# Patient Record
Sex: Female | Born: 1955
Health system: Southern US, Community
[De-identification: ages and names within clinical notes are randomized; demographics above are authoritative.]

## PROBLEM LIST (undated history)

## (undated) ENCOUNTER — Emergency Department (HOSPITAL_COMMUNITY): Admission: EM | Payer: BC Managed Care – PPO | Source: Home / Self Care

## (undated) DIAGNOSIS — R232 Flushing: Secondary | ICD-10-CM

## (undated) DIAGNOSIS — Z923 Personal history of irradiation: Secondary | ICD-10-CM

## (undated) DIAGNOSIS — J302 Other seasonal allergic rhinitis: Secondary | ICD-10-CM

## (undated) DIAGNOSIS — Z87898 Personal history of other specified conditions: Secondary | ICD-10-CM

## (undated) DIAGNOSIS — C50919 Malignant neoplasm of unspecified site of unspecified female breast: Secondary | ICD-10-CM

## (undated) DIAGNOSIS — K259 Gastric ulcer, unspecified as acute or chronic, without hemorrhage or perforation: Secondary | ICD-10-CM

## (undated) DIAGNOSIS — K219 Gastro-esophageal reflux disease without esophagitis: Secondary | ICD-10-CM

## (undated) DIAGNOSIS — T451X5A Adverse effect of antineoplastic and immunosuppressive drugs, initial encounter: Secondary | ICD-10-CM

## (undated) DIAGNOSIS — C50419 Malignant neoplasm of upper-outer quadrant of unspecified female breast: Principal | ICD-10-CM

## (undated) DIAGNOSIS — D649 Anemia, unspecified: Secondary | ICD-10-CM

## (undated) HISTORY — DX: Anemia, unspecified: D64.9

## (undated) HISTORY — DX: Malignant neoplasm of upper-outer quadrant of unspecified female breast: C50.419

## (undated) HISTORY — DX: Gastro-esophageal reflux disease without esophagitis: K21.9

## (undated) HISTORY — PX: BREAST BIOPSY: SHX20

## (undated) HISTORY — PX: OTHER SURGICAL HISTORY: SHX169

## (undated) HISTORY — DX: Personal history of other specified conditions: Z87.898

---

## 1995-05-31 HISTORY — PX: TOTAL ABDOMINAL HYSTERECTOMY: SHX209

## 2000-03-09 ENCOUNTER — Encounter: Admission: RE | Admit: 2000-03-09 | Discharge: 2000-03-09 | Payer: Self-pay | Admitting: *Deleted

## 2000-03-09 ENCOUNTER — Encounter: Payer: Self-pay | Admitting: *Deleted

## 2000-07-12 ENCOUNTER — Encounter: Admission: RE | Admit: 2000-07-12 | Discharge: 2000-07-12 | Payer: Self-pay | Admitting: Family Medicine

## 2000-07-12 ENCOUNTER — Encounter: Payer: Self-pay | Admitting: Family Medicine

## 2000-09-27 ENCOUNTER — Encounter: Admission: RE | Admit: 2000-09-27 | Discharge: 2000-09-27 | Payer: Self-pay | Admitting: *Deleted

## 2002-07-16 ENCOUNTER — Encounter: Payer: Self-pay | Admitting: Internal Medicine

## 2002-07-16 ENCOUNTER — Encounter: Admission: RE | Admit: 2002-07-16 | Discharge: 2002-07-16 | Payer: Self-pay | Admitting: Internal Medicine

## 2003-07-18 ENCOUNTER — Encounter: Admission: RE | Admit: 2003-07-18 | Discharge: 2003-07-18 | Payer: Self-pay | Admitting: Internal Medicine

## 2004-04-06 ENCOUNTER — Ambulatory Visit: Payer: Self-pay | Admitting: General Practice

## 2004-07-13 ENCOUNTER — Other Ambulatory Visit: Admission: RE | Admit: 2004-07-13 | Discharge: 2004-07-13 | Payer: Self-pay | Admitting: Internal Medicine

## 2005-04-19 ENCOUNTER — Ambulatory Visit: Payer: Self-pay | Admitting: General Practice

## 2005-05-30 HISTORY — PX: CERVICAL DISCECTOMY: SHX98

## 2005-05-30 HISTORY — PX: OTHER SURGICAL HISTORY: SHX169

## 2005-07-22 ENCOUNTER — Encounter: Admission: RE | Admit: 2005-07-22 | Discharge: 2005-07-22 | Payer: Self-pay | Admitting: Internal Medicine

## 2007-08-07 ENCOUNTER — Other Ambulatory Visit: Admission: RE | Admit: 2007-08-07 | Discharge: 2007-08-07 | Payer: Self-pay | Admitting: Internal Medicine

## 2010-05-30 DIAGNOSIS — K259 Gastric ulcer, unspecified as acute or chronic, without hemorrhage or perforation: Secondary | ICD-10-CM | POA: Insufficient documentation

## 2010-05-30 DIAGNOSIS — K219 Gastro-esophageal reflux disease without esophagitis: Secondary | ICD-10-CM | POA: Insufficient documentation

## 2010-05-30 HISTORY — DX: Gastric ulcer, unspecified as acute or chronic, without hemorrhage or perforation: K25.9

## 2010-05-30 HISTORY — DX: Gastro-esophageal reflux disease without esophagitis: K21.9

## 2010-08-16 ENCOUNTER — Other Ambulatory Visit: Payer: Self-pay | Admitting: Internal Medicine

## 2010-08-16 ENCOUNTER — Other Ambulatory Visit (HOSPITAL_COMMUNITY)
Admission: RE | Admit: 2010-08-16 | Discharge: 2010-08-16 | Disposition: A | Discharge status: Home / Self Care | Payer: BC Managed Care – PPO | Source: Ambulatory Visit | Attending: Internal Medicine | Admitting: Internal Medicine

## 2010-08-16 DIAGNOSIS — Z01419 Encounter for gynecological examination (general) (routine) without abnormal findings: Secondary | ICD-10-CM | POA: Insufficient documentation

## 2010-08-16 DIAGNOSIS — Z1159 Encounter for screening for other viral diseases: Secondary | ICD-10-CM | POA: Insufficient documentation

## 2011-03-28 ENCOUNTER — Emergency Department (HOSPITAL_COMMUNITY)
Admission: EM | Admit: 2011-03-28 | Discharge: 2011-03-29 | Discharge status: Against Medical Advice | Payer: BC Managed Care – PPO | Attending: Emergency Medicine | Admitting: Emergency Medicine

## 2011-03-28 DIAGNOSIS — R109 Unspecified abdominal pain: Secondary | ICD-10-CM | POA: Insufficient documentation

## 2011-03-29 ENCOUNTER — Ambulatory Visit (HOSPITAL_COMMUNITY): Admission: RE | Admit: 2011-03-29 | Payer: BC Managed Care – PPO | Source: Ambulatory Visit

## 2011-03-29 ENCOUNTER — Emergency Department (HOSPITAL_COMMUNITY): Payer: BC Managed Care – PPO

## 2011-03-29 ENCOUNTER — Other Ambulatory Visit: Payer: Self-pay | Admitting: Internal Medicine

## 2011-03-29 ENCOUNTER — Emergency Department (HOSPITAL_COMMUNITY)
Admission: EM | Admit: 2011-03-29 | Discharge: 2011-03-29 | Disposition: A | Discharge status: Home / Self Care | Payer: BC Managed Care – PPO | Attending: Emergency Medicine | Admitting: Emergency Medicine

## 2011-03-29 DIAGNOSIS — R1011 Right upper quadrant pain: Secondary | ICD-10-CM

## 2011-03-29 DIAGNOSIS — R188 Other ascites: Secondary | ICD-10-CM

## 2011-03-29 DIAGNOSIS — R112 Nausea with vomiting, unspecified: Secondary | ICD-10-CM | POA: Insufficient documentation

## 2011-03-29 DIAGNOSIS — R109 Unspecified abdominal pain: Secondary | ICD-10-CM | POA: Insufficient documentation

## 2011-03-29 LAB — DIFFERENTIAL
Eosinophils Absolute: 0 10*3/uL (ref 0.0–0.7)
Eosinophils Relative: 0 % (ref 0–5)
Lymphs Abs: 1.1 10*3/uL (ref 0.7–4.0)
Monocytes Relative: 4 % (ref 3–12)

## 2011-03-29 LAB — POCT PREGNANCY, URINE: Preg Test, Ur: NEGATIVE

## 2011-03-29 LAB — CBC
MCH: 30.8 pg (ref 26.0–34.0)
MCHC: 33.9 g/dL (ref 30.0–36.0)
MCV: 90.9 fL (ref 78.0–100.0)
Platelets: 287 10*3/uL (ref 150–400)
RDW: 11.9 % (ref 11.5–15.5)
WBC: 11.4 10*3/uL — ABNORMAL HIGH (ref 4.0–10.5)

## 2011-03-29 LAB — BASIC METABOLIC PANEL
Calcium: 9.4 mg/dL (ref 8.4–10.5)
Creatinine, Ser: 0.69 mg/dL (ref 0.50–1.10)
GFR calc Af Amer: >90 mL/min (ref >90)
GFR calc non Af Amer: >90 mL/min (ref >90)

## 2011-03-29 LAB — LIPASE, BLOOD: Lipase: 14 U/L (ref 11–59)

## 2011-03-29 LAB — URINALYSIS, ROUTINE W REFLEX MICROSCOPIC
Nitrite: NEGATIVE
Protein, ur: NEGATIVE mg/dL
Specific Gravity, Urine: 1.027 (ref 1.005–1.030)
Urobilinogen, UA: 0.2 mg/dL (ref 0.0–1.0)

## 2011-03-29 LAB — HEPATIC FUNCTION PANEL: Total Protein: 7.6 g/dL (ref 6.0–8.3)

## 2011-03-29 MED ORDER — IOHEXOL 300 MG/ML  SOLN
100.0000 mL | Freq: Once | INTRAMUSCULAR | Status: AC | PRN
  Administered 2011-03-29: 100 mL via INTRAVENOUS

## 2011-03-31 ENCOUNTER — Ambulatory Visit
Admission: RE | Admit: 2011-03-31 | Discharge: 2011-03-31 | Disposition: A | Discharge status: Home / Self Care | Payer: BC Managed Care – PPO | Source: Ambulatory Visit | Attending: Internal Medicine | Admitting: Internal Medicine

## 2011-03-31 DIAGNOSIS — R188 Other ascites: Secondary | ICD-10-CM

## 2011-03-31 DIAGNOSIS — R1011 Right upper quadrant pain: Secondary | ICD-10-CM

## 2011-03-31 LAB — URINE CULTURE

## 2011-04-12 ENCOUNTER — Other Ambulatory Visit (HOSPITAL_COMMUNITY): Payer: Self-pay | Admitting: Unknown Physician Specialty

## 2011-04-27 ENCOUNTER — Encounter (INDEPENDENT_AMBULATORY_CARE_PROVIDER_SITE_OTHER): Payer: Self-pay | Admitting: Surgery

## 2011-04-28 ENCOUNTER — Encounter (HOSPITAL_COMMUNITY)
Admission: RE | Admit: 2011-04-28 | Discharge: 2011-04-28 | Disposition: A | Discharge status: Home / Self Care | Payer: BC Managed Care – PPO | Source: Ambulatory Visit | Attending: Unknown Physician Specialty | Admitting: Unknown Physician Specialty

## 2011-04-28 DIAGNOSIS — R109 Unspecified abdominal pain: Secondary | ICD-10-CM | POA: Insufficient documentation

## 2011-04-28 MED ORDER — TECHNETIUM TC 99M MEBROFENIN IV KIT
5.0000 | PACK | Freq: Once | INTRAVENOUS | Status: AC | PRN
  Administered 2011-04-28: 5 via INTRAVENOUS

## 2011-04-28 MED ORDER — SINCALIDE 5 MCG IJ SOLR
INTRAMUSCULAR | Status: AC
  Administered 2011-04-28: 1.58 ug via INTRAVENOUS
  Filled 2011-04-28: qty 5

## 2011-04-30 DIAGNOSIS — C50919 Malignant neoplasm of unspecified site of unspecified female breast: Secondary | ICD-10-CM

## 2011-04-30 HISTORY — DX: Malignant neoplasm of unspecified site of unspecified female breast: C50.919

## 2011-05-03 ENCOUNTER — Ambulatory Visit (INDEPENDENT_AMBULATORY_CARE_PROVIDER_SITE_OTHER): Payer: BC Managed Care – PPO | Admitting: Surgery

## 2011-05-03 ENCOUNTER — Encounter (INDEPENDENT_AMBULATORY_CARE_PROVIDER_SITE_OTHER): Payer: Self-pay | Admitting: Surgery

## 2011-05-03 VITALS — BP 118/80 | HR 80 | Temp 97.2°F | Resp 16 | Ht 67.0 in | Wt 164.4 lb

## 2011-05-03 DIAGNOSIS — N6310 Unspecified lump in the right breast, unspecified quadrant: Secondary | ICD-10-CM

## 2011-05-03 DIAGNOSIS — N63 Unspecified lump in unspecified breast: Secondary | ICD-10-CM

## 2011-05-03 NOTE — Progress Notes (Signed)
Patient ID: Casey Wall, female   DOB: 05/30/1955, 55 y.o.   MRN: [id number]  Chief Complaint  Patient presents with  . New Evaluation    eval of right breast mass    Referred by: Dr. Husain Referral reason Breast mass HPI Casey Wall is a 55 y.o. female.  She has had a history of some breast cysts aspirated in the past. She has no current breast symptoms. A recent mammogram showed an area of asymmetry in the right breast at appears to have increased with little since her prior mammogram. An ultrasound showed a benign area but the radiologist thinks that this is not the same area as seen on the mammogram and has suggested that a needle guided excisional biopsy is appropriate for this. The patient comes here to discuss this issue. HPI  Past Medical History  Diagnosis Date  . GERD (gastroesophageal reflux disease)   . Seasonal rhinitis   . Fibrocystic breast   . Constipation   . Back pain   . Anemia     Past Surgical History  Procedure Date  . Total abdominal hysterectomy 1997  . Cesarean section 1980  . Cervical discectomy 2007    Family History  Problem Relation Age of Onset  . Lung cancer Father   . Diabetes Mother     Social History History  Substance Use Topics  . Smoking status: Never Smoker   . Smokeless tobacco: Never Used  . Alcohol Use: Yes     rare    No Known Allergies  Current Outpatient Prescriptions  Medication Sig Dispense Refill  . cetirizine (ZYRTEC) 10 MG tablet Take 10 mg by mouth daily.        . cholecalciferol (VITAMIN D) 1000 UNITS tablet Take 1,000 Units by mouth daily.        . estradiol (VIVELLE-DOT) 0.05 MG/24HR Place 1 patch onto the skin once a week.        . fluticasone (FLONASE) 50 MCG/ACT nasal spray Place 2 sprays into the nose daily.        . Polyethylene Glycol 3350 (MIRALAX PO) Take by mouth as needed.        . sucralfate (CARAFATE) 1 G tablet       . tretinoin microspheres (RETIN-A MICRO) 0.04 % gel Apply topically at  bedtime.          Review of Systems Review of Systems  Constitutional: Negative for fever, chills and unexpected weight change.  HENT: Negative for hearing loss, congestion, sore throat, trouble swallowing and voice change.   Eyes: Negative for visual disturbance.  Respiratory: Negative for cough and wheezing.   Cardiovascular: Negative for chest pain, palpitations and leg swelling.  Gastrointestinal: Negative for nausea, vomiting, abdominal pain, diarrhea, constipation, blood in stool, abdominal distention and anal bleeding.  Genitourinary: Negative for hematuria, vaginal bleeding and difficulty urinating.  Musculoskeletal: Negative for arthralgias.  Skin: Negative for rash and wound.  Neurological: Negative for seizures, syncope and headaches.  Hematological: Negative for adenopathy. Does not bruise/bleed easily.  Psychiatric/Behavioral: Negative for confusion.    Blood pressure 118/80, pulse 80, temperature 97.2 F (36.2 C), temperature source Temporal, resp. rate 16, height 5' 7" (1.702 m), weight 164 lb 6 oz (74.56 kg).  Physical Exam Physical Exam  Constitutional: She appears well-developed and well-nourished. No distress.  HENT:  Head: Normocephalic and atraumatic.  Eyes: Pupils are equal, round, and reactive to light. Right eye exhibits no discharge. No scleral icterus.  Neck: Neck supple. No   tracheal deviation present. No thyromegaly present.  Cardiovascular: Normal rate and regular rhythm.  Exam reveals no gallop and no friction rub.   No murmur heard. Pulmonary/Chest: Breath sounds normal. No respiratory distress. She has no wheezes.  Abdominal: Soft. She exhibits no distension. There is no tenderness.  Musculoskeletal: She exhibits no edema and no tenderness.  Neurological: No cranial nerve deficit.  Skin: Skin is warm and dry. She is not diaphoretic.  Psychiatric: She has a normal mood and affect. Her behavior is normal.   Breasts: Symmetric in appearance. No right  side mass but she has a well circumscribed left mass, UOQ 2cm, not tender, not fixed, C/W cyst Lymp: No axillary or supraclavicular adenopathy Data Reviewed Mamogram and ultrasound reviewed with radiologist, Dr Jackson. Theright lesion is very suspicious for cancer , its in axillary tail. The left side has what appear to be cysts. He thinks this is amenable to core biopsy  Assessment    Right breast mass, suspicious for ca Left breast mass, likely cystI    Plan I discussed the issues with the patient. She would like to try the core biopsy before going to an excisional biopsy. I told her this is suspicious for cancer           Ozella Comins J 05/03/2011, 9:24 AM    

## 2011-05-03 NOTE — Patient Instructions (Signed)
We will arrange for a needle biopsy of the area on the right and make plans once that is done

## 2011-05-04 ENCOUNTER — Ambulatory Visit
Admission: RE | Admit: 2011-05-04 | Discharge: 2011-05-04 | Disposition: A | Discharge status: Home / Self Care | Payer: BC Managed Care – PPO | Source: Ambulatory Visit | Attending: Surgery | Admitting: Surgery

## 2011-05-04 ENCOUNTER — Other Ambulatory Visit (INDEPENDENT_AMBULATORY_CARE_PROVIDER_SITE_OTHER): Payer: Self-pay | Admitting: Surgery

## 2011-05-04 DIAGNOSIS — N6310 Unspecified lump in the right breast, unspecified quadrant: Secondary | ICD-10-CM

## 2011-05-05 ENCOUNTER — Ambulatory Visit
Admission: RE | Admit: 2011-05-05 | Discharge: 2011-05-05 | Disposition: A | Discharge status: Home / Self Care | Payer: BC Managed Care – PPO | Source: Ambulatory Visit | Attending: Surgery | Admitting: Surgery

## 2011-05-05 ENCOUNTER — Other Ambulatory Visit (INDEPENDENT_AMBULATORY_CARE_PROVIDER_SITE_OTHER): Payer: Self-pay | Admitting: Surgery

## 2011-05-05 DIAGNOSIS — N6310 Unspecified lump in the right breast, unspecified quadrant: Secondary | ICD-10-CM

## 2011-05-05 DIAGNOSIS — C50911 Malignant neoplasm of unspecified site of right female breast: Secondary | ICD-10-CM

## 2011-05-09 ENCOUNTER — Ambulatory Visit
Admission: RE | Admit: 2011-05-09 | Discharge: 2011-05-09 | Disposition: A | Discharge status: Home / Self Care | Payer: BC Managed Care – PPO | Source: Ambulatory Visit | Attending: Surgery | Admitting: Surgery

## 2011-05-09 DIAGNOSIS — C50911 Malignant neoplasm of unspecified site of right female breast: Secondary | ICD-10-CM

## 2011-05-09 MED ORDER — GADOBENATE DIMEGLUMINE 529 MG/ML IV SOLN
15.0000 mL | Freq: Once | INTRAVENOUS | Status: AC | PRN
  Administered 2011-05-09: 15 mL via INTRAVENOUS

## 2011-05-10 ENCOUNTER — Encounter (INDEPENDENT_AMBULATORY_CARE_PROVIDER_SITE_OTHER): Payer: Self-pay | Admitting: Surgery

## 2011-05-10 ENCOUNTER — Ambulatory Visit (INDEPENDENT_AMBULATORY_CARE_PROVIDER_SITE_OTHER): Payer: BC Managed Care – PPO | Admitting: Surgery

## 2011-05-10 VITALS — BP 118/76 | HR 68 | Temp 97.1°F | Resp 18 | Ht 66.0 in | Wt 161.8 lb

## 2011-05-10 DIAGNOSIS — C50411 Malignant neoplasm of upper-outer quadrant of right female breast: Secondary | ICD-10-CM | POA: Insufficient documentation

## 2011-05-10 DIAGNOSIS — C50419 Malignant neoplasm of upper-outer quadrant of unspecified female breast: Secondary | ICD-10-CM

## 2011-05-10 HISTORY — DX: Malignant neoplasm of upper-outer quadrant of unspecified female breast: C50.419

## 2011-05-10 NOTE — Patient Instructions (Signed)
Call me tomorrow so we can make final plans

## 2011-05-10 NOTE — Progress Notes (Signed)
Chief complaint: Breast cancer  History of present illness: I saw this patient last week with a newly diagnosed breast mass in the upper outer quadrant of the right breast almost in the axilla that was suspicious for breast cancer. She has had a biopsy done which shows invasive ductal carcinoma. Prognostic indicators are pending. An MRI shows only the single lesion with no evidence of other breast cancers or axillary metastases.  Exam: Gen.: The patient alert oriented but somewhat anxious-appearing Breasts: Her small ecchymosis in the upper outer quadrant of the right breast. There still no palpable mass. There is no axillary adenopathy.  Data reviewed: I reviewed the path report as well as the MRI report.  Impression: Clinical stage I right breast cancer upper outer quadrant  Plan: I think she is an excellent candidate currently for a needle guided lumpectomy and sentinel lymph node evaluation. I have gone over the diagnosis of breast cancer the options for treatment and potential need for further treatment including radiation therapy and chemotherapy. I spent the majority of this visit (30 minutes) on counseling and discussion of her diagnosis and treatment options.  She is to be present tomorrow at the breast conference. We can make further plans depending on that discussion. She will call me tomorrow and we will finalize plans. I think all questions have been answered. Her husband, daughter, and sisters are all here for that discussion.

## 2011-05-11 ENCOUNTER — Telehealth (INDEPENDENT_AMBULATORY_CARE_PROVIDER_SITE_OTHER): Payer: Self-pay | Admitting: Surgery

## 2011-05-11 NOTE — Progress Notes (Signed)
Addended by: Currie Paris on: 05/11/2011 02:59 PM   Modules accepted: Orders

## 2011-05-11 NOTE — Telephone Encounter (Signed)
She was presented at the breast conference this morning. It was noted that her cancer is receptor positive, HER-2 negative with a low Ki-67. It was agreed that a lumpectomy and sentinel node would be the appropriate therapy. I called her and reviewed that with her and she wishes to go ahead and schedule surgery. We will try to arrange this to be done fairly soon.

## 2011-05-12 ENCOUNTER — Other Ambulatory Visit (INDEPENDENT_AMBULATORY_CARE_PROVIDER_SITE_OTHER): Payer: Self-pay | Admitting: Surgery

## 2011-05-12 DIAGNOSIS — C50419 Malignant neoplasm of upper-outer quadrant of unspecified female breast: Secondary | ICD-10-CM

## 2011-05-25 ENCOUNTER — Ambulatory Visit (HOSPITAL_COMMUNITY): Admission: RE | Admit: 2011-05-25 | Payer: BC Managed Care – PPO | Source: Ambulatory Visit

## 2011-05-25 ENCOUNTER — Encounter (HOSPITAL_BASED_OUTPATIENT_CLINIC_OR_DEPARTMENT_OTHER): Payer: Self-pay | Admitting: *Deleted

## 2011-05-25 NOTE — Pre-Procedure Instructions (Signed)
To go to [facility]. Imaging for CXR - order faxed.

## 2011-05-27 ENCOUNTER — Other Ambulatory Visit (INDEPENDENT_AMBULATORY_CARE_PROVIDER_SITE_OTHER): Payer: Self-pay | Admitting: Surgery

## 2011-05-27 ENCOUNTER — Ambulatory Visit
Admission: RE | Admit: 2011-05-27 | Discharge: 2011-05-27 | Disposition: A | Discharge status: Home / Self Care | Payer: BC Managed Care – PPO | Source: Ambulatory Visit | Attending: Surgery | Admitting: Surgery

## 2011-05-27 DIAGNOSIS — Z01811 Encounter for preprocedural respiratory examination: Secondary | ICD-10-CM

## 2011-06-01 ENCOUNTER — Other Ambulatory Visit (INDEPENDENT_AMBULATORY_CARE_PROVIDER_SITE_OTHER): Payer: Self-pay | Admitting: Surgery

## 2011-06-01 ENCOUNTER — Encounter (HOSPITAL_BASED_OUTPATIENT_CLINIC_OR_DEPARTMENT_OTHER)
Admission: RE | Disposition: A | Discharge status: Home / Self Care | Payer: Self-pay | Source: Ambulatory Visit | Attending: Surgery

## 2011-06-01 ENCOUNTER — Ambulatory Visit
Admission: RE | Admit: 2011-06-01 | Discharge: 2011-06-01 | Disposition: A | Discharge status: Home / Self Care | Payer: BC Managed Care – PPO | Source: Ambulatory Visit | Attending: Surgery | Admitting: Surgery

## 2011-06-01 ENCOUNTER — Ambulatory Visit (HOSPITAL_COMMUNITY)
Admission: RE | Admit: 2011-06-01 | Discharge: 2011-06-01 | Disposition: A | Discharge status: Home / Self Care | Payer: BC Managed Care – PPO | Source: Ambulatory Visit | Attending: Surgery | Admitting: Surgery

## 2011-06-01 ENCOUNTER — Encounter (HOSPITAL_BASED_OUTPATIENT_CLINIC_OR_DEPARTMENT_OTHER): Payer: Self-pay | Admitting: Anesthesiology

## 2011-06-01 ENCOUNTER — Ambulatory Visit (HOSPITAL_BASED_OUTPATIENT_CLINIC_OR_DEPARTMENT_OTHER): Payer: BC Managed Care – PPO | Admitting: Anesthesiology

## 2011-06-01 ENCOUNTER — Encounter (HOSPITAL_BASED_OUTPATIENT_CLINIC_OR_DEPARTMENT_OTHER): Payer: Self-pay | Admitting: *Deleted

## 2011-06-01 ENCOUNTER — Ambulatory Visit (HOSPITAL_BASED_OUTPATIENT_CLINIC_OR_DEPARTMENT_OTHER)
Admission: RE | Admit: 2011-06-01 | Discharge: 2011-06-01 | Disposition: A | Discharge status: Home / Self Care | Payer: BC Managed Care – PPO | Source: Ambulatory Visit | Attending: Surgery | Admitting: Surgery

## 2011-06-01 DIAGNOSIS — C773 Secondary and unspecified malignant neoplasm of axilla and upper limb lymph nodes: Secondary | ICD-10-CM | POA: Insufficient documentation

## 2011-06-01 DIAGNOSIS — C50419 Malignant neoplasm of upper-outer quadrant of unspecified female breast: Secondary | ICD-10-CM

## 2011-06-01 DIAGNOSIS — K219 Gastro-esophageal reflux disease without esophagitis: Secondary | ICD-10-CM | POA: Insufficient documentation

## 2011-06-01 DIAGNOSIS — Z01812 Encounter for preprocedural laboratory examination: Secondary | ICD-10-CM | POA: Insufficient documentation

## 2011-06-01 DIAGNOSIS — C50411 Malignant neoplasm of upper-outer quadrant of right female breast: Secondary | ICD-10-CM | POA: Insufficient documentation

## 2011-06-01 HISTORY — DX: Other seasonal allergic rhinitis: J30.2

## 2011-06-01 HISTORY — DX: Malignant neoplasm of unspecified site of unspecified female breast: C50.919

## 2011-06-01 HISTORY — PX: BREAST LUMPECTOMY: SHX2

## 2011-06-01 HISTORY — DX: Gastric ulcer, unspecified as acute or chronic, without hemorrhage or perforation: K25.9

## 2011-06-01 LAB — POCT HEMOGLOBIN-HEMACUE: Hemoglobin: 13.2 g/dL (ref 12.0–15.0)

## 2011-06-01 SURGERY — BREAST LUMPECTOMY WITH SENTINEL LYMPH NODE BX
Anesthesia: Choice | Site: Breast | Laterality: Right | Wound class: Clean

## 2011-06-01 MED ORDER — FENTANYL CITRATE 0.05 MG/ML IJ SOLN
50.0000 ug | INTRAMUSCULAR | Status: DC | PRN
  Administered 2011-06-01: 50 ug via INTRAVENOUS

## 2011-06-01 MED ORDER — DEXAMETHASONE SODIUM PHOSPHATE 4 MG/ML IJ SOLN
INTRAMUSCULAR | Status: DC | PRN
  Administered 2011-06-01: 10 mg via INTRAVENOUS

## 2011-06-01 MED ORDER — MEPERIDINE HCL 25 MG/ML IJ SOLN: 6.2500 mg | INTRAMUSCULAR | Status: DC | PRN

## 2011-06-01 MED ORDER — SODIUM CHLORIDE 0.9 % IJ SOLN
INTRAMUSCULAR | Status: DC | PRN
  Administered 2011-06-01: 3 mL via INTRAVENOUS

## 2011-06-01 MED ORDER — LACTATED RINGERS IV SOLN
INTRAVENOUS | Status: DC
  Administered 2011-06-01 (×3): via INTRAVENOUS

## 2011-06-01 MED ORDER — HYDROMORPHONE HCL PF 1 MG/ML IJ SOLN
0.2500 mg | INTRAMUSCULAR | Status: DC | PRN
Start: 2011-06-01 — End: 2011-06-01
  Administered 2011-06-01 (×4): 0.5 mg via INTRAVENOUS

## 2011-06-01 MED ORDER — METHYLENE BLUE 1 % INJ SOLN
INTRAMUSCULAR | Status: DC | PRN
  Administered 2011-06-01: 2 mL via SUBMUCOSAL

## 2011-06-01 MED ORDER — HYDROMORPHONE HCL 2 MG PO TABS
2.0000 mg | ORAL_TABLET | Freq: Once | ORAL | Status: AC | PRN
  Administered 2011-06-01: 2 mg via ORAL

## 2011-06-01 MED ORDER — LIDOCAINE HCL (CARDIAC) 20 MG/ML IV SOLN
INTRAVENOUS | Status: DC | PRN
  Administered 2011-06-01: 100 mg via INTRAVENOUS

## 2011-06-01 MED ORDER — EPHEDRINE SULFATE 50 MG/ML IJ SOLN
INTRAMUSCULAR | Status: DC | PRN
  Administered 2011-06-01 (×2): 10 mg via INTRAVENOUS

## 2011-06-01 MED ORDER — HYDROMORPHONE HCL 4 MG PO TABS: 2.0000 mg | ORAL_TABLET | Freq: Four times a day (QID) | ORAL | Status: AC | PRN

## 2011-06-01 MED ORDER — BUPIVACAINE HCL (PF) 0.25 % IJ SOLN
INTRAMUSCULAR | Status: DC | PRN
  Administered 2011-06-01: 30 mL

## 2011-06-01 MED ORDER — PROPOFOL 10 MG/ML IV EMUL
INTRAVENOUS | Status: DC | PRN
  Administered 2011-06-01: 200 mg via INTRAVENOUS

## 2011-06-01 MED ORDER — ONDANSETRON HCL 4 MG/2ML IJ SOLN
4.0000 mg | Freq: Once | INTRAMUSCULAR | Status: AC | PRN
  Administered 2011-06-01: 4 mg via INTRAVENOUS

## 2011-06-01 MED ORDER — ONDANSETRON HCL 4 MG/2ML IJ SOLN
INTRAMUSCULAR | Status: DC | PRN
  Administered 2011-06-01: 4 mg via INTRAVENOUS

## 2011-06-01 MED ORDER — MORPHINE SULFATE 2 MG/ML IJ SOLN: 0.0500 mg/kg | INTRAMUSCULAR | Status: DC | PRN

## 2011-06-01 MED ORDER — TECHNETIUM TC 99M SULFUR COLLOID FILTERED
1.0000 | Freq: Once | INTRAVENOUS | Status: AC | PRN
  Administered 2011-06-01: 1 via INTRADERMAL

## 2011-06-01 MED ORDER — MIDAZOLAM HCL 2 MG/2ML IJ SOLN
1.0000 mg | INTRAMUSCULAR | Status: DC | PRN
  Administered 2011-06-01: 2 mg via INTRAVENOUS

## 2011-06-01 MED ORDER — CEFAZOLIN SODIUM-DEXTROSE 2-3 GM-% IV SOLR
2.0000 g | INTRAVENOUS | Status: AC
  Administered 2011-06-01: 2 g via INTRAVENOUS

## 2011-06-01 MED ORDER — FENTANYL CITRATE 0.05 MG/ML IJ SOLN
INTRAMUSCULAR | Status: DC | PRN
  Administered 2011-06-01: 50 ug via INTRAVENOUS
  Administered 2011-06-01 (×2): 25 ug via INTRAVENOUS

## 2011-06-01 SURGICAL SUPPLY — 59 items
ADH SKN CLS APL DERMABOND .7 (GAUZE/BANDAGES/DRESSINGS) ×2
APPLIER CLIP 11 MED OPEN (CLIP)
APPLIER CLIP 9.375 MED OPEN (MISCELLANEOUS)
APR CLP MED 11 20 MLT OPN (CLIP)
APR CLP MED 9.3 20 MLT OPN (MISCELLANEOUS)
BLADE HEX COATED 2.75 (ELECTRODE) ×2 IMPLANT
BLADE SURG 15 STRL LF DISP TIS (BLADE) ×2 IMPLANT
BLADE SURG 15 STRL SS (BLADE) ×4
CANISTER SUCTION 1200CC (MISCELLANEOUS) ×2 IMPLANT
CHLORAPREP W/TINT 26ML (MISCELLANEOUS) ×2 IMPLANT
CLIP APPLIE 11 MED OPEN (CLIP) IMPLANT
CLIP APPLIE 9.375 MED OPEN (MISCELLANEOUS) IMPLANT
CLIP TI MEDIUM 6 (CLIP) IMPLANT
CLIP TI WIDE RED SMALL 6 (CLIP) ×3 IMPLANT
CLOTH BEACON ORANGE TIMEOUT ST (SAFETY) ×2 IMPLANT
COVER KIT LFREE 14X147CM (MISCELLANEOUS) ×1 IMPLANT
COVER MAYO STAND STRL (DRAPES) ×2 IMPLANT
COVER PROBE W GEL 5X96 (DRAPES) ×2 IMPLANT
COVER TABLE BACK 60X90 (DRAPES) ×2 IMPLANT
DECANTER SPIKE VIAL GLASS SM (MISCELLANEOUS) IMPLANT
DERMABOND ADVANCED (GAUZE/BANDAGES/DRESSINGS) ×2
DERMABOND ADVANCED .7 DNX12 (GAUZE/BANDAGES/DRESSINGS) ×2 IMPLANT
DEVICE DUBIN W/COMP PLATE 8390 (MISCELLANEOUS) ×1 IMPLANT
DRAIN CHANNEL 19F RND (DRAIN) IMPLANT
DRAPE LAPAROSCOPIC ABDOMINAL (DRAPES) ×2 IMPLANT
DRAPE UTILITY XL STRL (DRAPES) ×2 IMPLANT
ELECT REM PT RETURN 9FT ADLT (ELECTROSURGICAL) ×2
ELECTRODE REM PT RTRN 9FT ADLT (ELECTROSURGICAL) ×1 IMPLANT
EVACUATOR SILICONE 100CC (DRAIN) IMPLANT
GLOVE ECLIPSE 6.5 STRL STRAW (GLOVE) ×1 IMPLANT
GLOVE EUDERMIC 7 POWDERFREE (GLOVE) ×2 IMPLANT
GOWN PREVENTION PLUS XLARGE (GOWN DISPOSABLE) ×4 IMPLANT
KIT MARKER MARGIN INK (KITS) ×2 IMPLANT
NDL HYPO 25X1 1.5 SAFETY (NEEDLE) ×2 IMPLANT
NDL SAFETY ECLIPSE 18X1.5 (NEEDLE) ×1 IMPLANT
NEEDLE HYPO 18GX1.5 SHARP (NEEDLE) ×2
NEEDLE HYPO 25X1 1.5 SAFETY (NEEDLE) ×4 IMPLANT
NS IRRIG 1000ML POUR BTL (IV SOLUTION) ×2 IMPLANT
PACK BASIN DAY SURGERY FS (CUSTOM PROCEDURE TRAY) ×2 IMPLANT
PENCIL BUTTON HOLSTER BLD 10FT (ELECTRODE) ×2 IMPLANT
PIN SAFETY STERILE (MISCELLANEOUS) IMPLANT
SLEEVE SCD COMPRESS KNEE MED (MISCELLANEOUS) ×2 IMPLANT
SPONGE GAUZE 4X4 12PLY (GAUZE/BANDAGES/DRESSINGS) IMPLANT
SPONGE INTESTINAL PEANUT (DISPOSABLE) IMPLANT
SPONGE LAP 18X18 X RAY DECT (DISPOSABLE) IMPLANT
SPONGE LAP 4X18 X RAY DECT (DISPOSABLE) ×3 IMPLANT
STAPLER VISISTAT 35W (STAPLE) ×2 IMPLANT
SUT ETHILON 2 0 FS 18 (SUTURE) IMPLANT
SUT ETHILON 3 0 FSL (SUTURE) IMPLANT
SUT MNCRL AB 4-0 PS2 18 (SUTURE) ×3 IMPLANT
SUT VIC AB 4-0 BRD 54 (SUTURE) IMPLANT
SUT VICRYL 3-0 CR8 SH (SUTURE) ×4 IMPLANT
SYR BULB 3OZ (MISCELLANEOUS) ×1 IMPLANT
SYR CONTROL 10ML LL (SYRINGE) ×4 IMPLANT
TOWEL OR 17X24 6PK STRL BLUE (TOWEL DISPOSABLE) ×2 IMPLANT
TOWEL OR NON WOVEN STRL DISP B (DISPOSABLE) ×2 IMPLANT
TUBE CONNECTING 20X1/4 (TUBING) ×2 IMPLANT
WATER STERILE IRR 1000ML POUR (IV SOLUTION) ×1 IMPLANT
YANKAUER SUCT BULB TIP NO VENT (SUCTIONS) ×2 IMPLANT

## 2011-06-01 NOTE — Anesthesia Postprocedure Evaluation (Signed)
  Anesthesia Post-op Note  Patient: Casey Wall  Procedure(s) Performed:  BREAST LUMPECTOMY WITH SENTINEL LYMPH NODE BX - Removal of right breast cancer and right sentinel nodes  Patient Location: PACU  Anesthesia Type: General  Level of Consciousness: awake, alert  and oriented  Airway and Oxygen Therapy: Patient Spontanous Breathing and Patient connected to face mask oxygen  Post-op Pain: moderate  Post-op Assessment: Post-op Vital signs reviewed, Patient's Cardiovascular Status Stable, Respiratory Function Stable, Patent Airway, No signs of Nausea or vomiting and Pain level controlled  Post-op Vital Signs: Reviewed and stable  Complications: No apparent anesthesia complications

## 2011-06-01 NOTE — Interval H&P Note (Signed)
History and Physical Interval Note:  06/01/2011 9:12 AM  Casey Wall  has presented today for surgery, with the diagnosis of right breast cancer  The various methods of treatment have been discussed with the patient and family. After consideration of risks, benefits and other options for treatment, the patient has consented to  Procedure(s): BREAST LUMPECTOMY WITH SENTINEL LYMPH NODE BX as a surgical intervention .  The patients' history has been reviewed, patient examined, no change in status, stable for surgery.  I have reviewed the patients' chart and labs.  Questions were answered to the patient's satisfaction.   I have marked the right breast as the operative side and reviewed the wire localization films. The guidewire appears to go through the tumor  [name]

## 2011-06-01 NOTE — Transfer of Care (Signed)
Immediate Anesthesia Transfer of Care Note  Patient: Casey Wall  Procedure(s) Performed:  BREAST LUMPECTOMY WITH SENTINEL LYMPH NODE BX - Removal of right breast cancer and right sentinel nodes  Patient Location: PACU  Anesthesia Type: General  Level of Consciousness: awake and oriented  Airway & Oxygen Therapy: Patient Spontanous Breathing and Patient connected to face mask oxygen  Post-op Assessment: Report given to PACU RN and Post -op Vital signs reviewed and stable  Post vital signs: Reviewed and stable  Complications: No apparent anesthesia complications

## 2011-06-01 NOTE — Op Note (Signed)
Casey Wall  [birth year]  [id number]  06/01/2011   Preoperative diagnosis: carcinoma right breast upper outer quadrant, clinical stage I  Postoperative diagnosis: same  Procedure: needle guided right partial mastectomy with the dye injection and right axillary sentinel lymph node excision  Surgeon: Currie Paris, MD, FACS  Anesthesia: General and LMA  Clinical History and Indications: this patient recently presented to my office with a mass in the right breast upper outer quadrant which had been seen on mammogram. We subsequently obtained a biopsy and this shows invasive ductal carcinoma. An MRI shows only the single abnormality. After we had discussion with the patient and review of alternatives she elected to proceed to a needle guided lumpectomy with sentinel node evaluation. She understands that she will need radiation therapy postoperatively and may need chemotherapy depending on final pathology results.  Description of Procedure:I saw the patient in the holding area and reviewed the plans for surgery as noted above. I marked the right breast as the operative side. I reviewed the x-ray films with the guidewire localizing wire in place.  The patient was taken to the operating room and after satisfactory general anesthesia had been obtained a time out was done. The right breast was then injected with dilute methylene blue and a subareolar fashion. This was massaged in. A full prep and drape was then done. I made a curvilinear incision directly over the mass. I elevated a very thin skin flap laterally and manipulated the guidewire into the wound. The mass was then palpable. I did a wide excision using palpation to try to make sure I had adequate margins in all directions. I went down to the chest wall it took fascia for the deep margin. By palpation I was well around the tumor in all directions.  I inked the specimen for margin orientation and took a specimen mammogram showing the clip  in the middle of the specimen.  I saw a tiny appear to be a lymph node adjacent to the lumpectomy edge and sent this as a specimen. It appeared to have a little bit of blue coloration. It was only about 1 mm. Using the neoprobe identified a hot area in the axilla. This is along the superior of my lumpectomy incision. I was able to open the clavipectoral fascia because this was directly under the lumpectomy cavity. I was able to identify a fairly large soft blue lymph node and excised that using a combination of blunt and cautery dissection. There is no other hot areas once this was removed. I used sutures and cautery to achieve hemostasis.  The axillary area was irrigated and when everything was dry I reapproximated the fascia to isolate the axillary area from the main lumpectomy cavity. I then irrigated the main lumpectomy cavity and made sure it was dry. I infiltrated about 30 cc of 0.25% plain Marcaine and help with postop pain relief. I then put baby clips on the margins of the lumpectomy cavity and close the cavity in layers with 3-0 Vicryl, 4-0 Monocryl subcuticular, and Dermabond.    The patient tolerated the procedure well. There were no operative complications. All counts were correct.   EBL: minimal  Currie Paris, MD, FACS 06/01/2011 11:09 AM

## 2011-06-01 NOTE — Anesthesia Preprocedure Evaluation (Addendum)
Anesthesia Evaluation  Patient identified by MRN, date of birth, ID band Patient awake    Reviewed: Allergy & Precautions, H&P , NPO status , Patient's Chart, lab work & pertinent test results  Airway Mallampati: I TM Distance: >3 FB Neck ROM: Full    Dental  (+) Teeth Intact and Dental Advisory Given   Pulmonary  clear to auscultation  Pulmonary exam normal       Cardiovascular Regular Normal    Neuro/Psych    GI/Hepatic   Endo/Other    Renal/GU      Musculoskeletal   Abdominal   Peds  Hematology   Anesthesia Other Findings   Reproductive/Obstetrics                          Anesthesia Physical Anesthesia Plan  ASA: II  Anesthesia Plan: General   Post-op Pain Management:    Induction: Intravenous  Airway Management Planned: LMA  Additional Equipment:   Intra-op Plan:   Post-operative Plan: Extubation in OR  Informed Consent: I have reviewed the patients History and Physical, chart, labs and discussed the procedure including the risks, benefits and alternatives for the proposed anesthesia with the patient or authorized representative who has indicated his/her understanding and acceptance.   Dental advisory given  Plan Discussed with: Anesthesiologist, Surgeon and CRNA  Anesthesia Plan Comments:         Anesthesia Quick Evaluation

## 2011-06-01 NOTE — Anesthesia Procedure Notes (Signed)
Procedure Name: LMA Insertion Date/Time: 06/01/2011 9:50 AM Performed by: Zenia Resides D Pre-anesthesia Checklist: Patient identified, Emergency Drugs available, Suction available and Patient being monitored Patient Re-evaluated:Patient Re-evaluated prior to inductionOxygen Delivery Method: Circle System Utilized Preoxygenation: Pre-oxygenation with 100% oxygen Intubation Type: IV induction Ventilation: Mask ventilation without difficulty LMA: LMA with gastric port inserted LMA Size: 4.0 Number of attempts: 1 Placement Confirmation: positive ETCO2 and breath sounds checked- equal and bilateral Tube secured with: Tape Dental Injury: Teeth and Oropharynx as per pre-operative assessment

## 2011-06-01 NOTE — H&P (View-Only) (Signed)
Patient ID: Casey Wall, female   DOB: 08/30/1955, 56 y.o.   MRN: [id number]  Chief Complaint  Patient presents with  . New Evaluation    eval of right breast mass    Referred by: Dr. Donette Larry Referral reason Breast mass HPI Casey Wall is a 56 y.o. female.  She has had a history of some breast cysts aspirated in the past. She has no current breast symptoms. A recent mammogram showed an area of asymmetry in the right breast at appears to have increased with little since her prior mammogram. An ultrasound showed a benign area but the radiologist thinks that this is not the same area as seen on the mammogram and has suggested that a needle guided excisional biopsy is appropriate for this. The patient comes here to discuss this issue. HPI  Past Medical History  Diagnosis Date  . GERD (gastroesophageal reflux disease)   . Seasonal rhinitis   . Fibrocystic breast   . Constipation   . Back pain   . Anemia     Past Surgical History  Procedure Date  . Total abdominal hysterectomy 1997  . Cesarean section 1980  . Cervical discectomy 2007    Family History  Problem Relation Age of Onset  . Lung cancer Father   . Diabetes Mother     Social History History  Substance Use Topics  . Smoking status: Never Smoker   . Smokeless tobacco: Never Used  . Alcohol Use: Yes     rare    No Known Allergies  Current Outpatient Prescriptions  Medication Sig Dispense Refill  . cetirizine (ZYRTEC) 10 MG tablet Take 10 mg by mouth daily.        . cholecalciferol (VITAMIN D) 1000 UNITS tablet Take 1,000 Units by mouth daily.        Marland Kitchen estradiol (VIVELLE-DOT) 0.05 MG/24HR Place 1 patch onto the skin once a week.        . fluticasone (FLONASE) 50 MCG/ACT nasal spray Place 2 sprays into the nose daily.        . Polyethylene Glycol 3350 (MIRALAX PO) Take by mouth as needed.        . sucralfate (CARAFATE) 1 G tablet       . tretinoin microspheres (RETIN-A MICRO) 0.04 % gel Apply topically at  bedtime.          Review of Systems Review of Systems  Constitutional: Negative for fever, chills and unexpected weight change.  HENT: Negative for hearing loss, congestion, sore throat, trouble swallowing and voice change.   Eyes: Negative for visual disturbance.  Respiratory: Negative for cough and wheezing.   Cardiovascular: Negative for chest pain, palpitations and leg swelling.  Gastrointestinal: Negative for nausea, vomiting, abdominal pain, diarrhea, constipation, blood in stool, abdominal distention and anal bleeding.  Genitourinary: Negative for hematuria, vaginal bleeding and difficulty urinating.  Musculoskeletal: Negative for arthralgias.  Skin: Negative for rash and wound.  Neurological: Negative for seizures, syncope and headaches.  Hematological: Negative for adenopathy. Does not bruise/bleed easily.  Psychiatric/Behavioral: Negative for confusion.    Blood pressure 118/80, pulse 80, temperature 97.2 F (36.2 C), temperature source Temporal, resp. rate 16, height 5\' 7"  (1.702 m), weight 164 lb 6 oz (74.56 kg).  Physical Exam Physical Exam  Constitutional: She appears well-developed and well-nourished. No distress.  HENT:  Head: Normocephalic and atraumatic.  Eyes: Pupils are equal, round, and reactive to light. Right eye exhibits no discharge. No scleral icterus.  Neck: Neck supple. No  tracheal deviation present. No thyromegaly present.  Cardiovascular: Normal rate and regular rhythm.  Exam reveals no gallop and no friction rub.   No murmur heard. Pulmonary/Chest: Breath sounds normal. No respiratory distress. She has no wheezes.  Abdominal: Soft. She exhibits no distension. There is no tenderness.  Musculoskeletal: She exhibits no edema and no tenderness.  Neurological: No cranial nerve deficit.  Skin: Skin is warm and dry. She is not diaphoretic.  Psychiatric: She has a normal mood and affect. Her behavior is normal.   Breasts: Symmetric in appearance. No right  side mass but she has a well circumscribed left mass, UOQ 2cm, not tender, not fixed, C/W cyst Lymp: No axillary or supraclavicular adenopathy Data Reviewed Mamogram and ultrasound reviewed with radiologist, Dr Jean Rosenthal. Theright lesion is very suspicious for cancer , its in axillary tail. The left side has what appear to be cysts. He thinks this is amenable to core biopsy  Assessment    Right breast mass, suspicious for ca Left breast mass, likely cystI    Plan I discussed the issues with the patient. She would like to try the core biopsy before going to an excisional biopsy. I told her this is suspicious for cancer           Lino Wickliff J 05/03/2011, 9:24 AM

## 2011-06-02 ENCOUNTER — Telehealth (INDEPENDENT_AMBULATORY_CARE_PROVIDER_SITE_OTHER): Payer: Self-pay | Admitting: General Surgery

## 2011-06-02 NOTE — Telephone Encounter (Signed)
Patient added to the Breast cancer conference list for Wednesday.

## 2011-06-03 ENCOUNTER — Telehealth (INDEPENDENT_AMBULATORY_CARE_PROVIDER_SITE_OTHER): Payer: Self-pay | Admitting: Surgery

## 2011-06-03 ENCOUNTER — Encounter (INDEPENDENT_AMBULATORY_CARE_PROVIDER_SITE_OTHER): Payer: Self-pay | Admitting: Internal Medicine

## 2011-06-03 ENCOUNTER — Telehealth: Payer: Self-pay | Admitting: *Deleted

## 2011-06-03 NOTE — Telephone Encounter (Signed)
Her pathology report came back today showing a well-differentiated invasive low-grade ductal carcinoma 1.7 cm with negative margins. Unfortunately one lymph node had metastatic carcinoma measuring 0.8 cm. I spoke with her and went over the pathology report with her. She is scheduled to see me next week and also has medical and radiation oncology visits scheduled.

## 2011-06-03 NOTE — Telephone Encounter (Signed)
Confirmed 06/14/11 appt w/ pt.  Mailed before appt letter & packet to pt.

## 2011-06-10 ENCOUNTER — Ambulatory Visit (INDEPENDENT_AMBULATORY_CARE_PROVIDER_SITE_OTHER): Payer: BC Managed Care – PPO | Admitting: Surgery

## 2011-06-10 ENCOUNTER — Encounter (INDEPENDENT_AMBULATORY_CARE_PROVIDER_SITE_OTHER): Payer: Self-pay | Admitting: Surgery

## 2011-06-10 VITALS — BP 108/78 | HR 76 | Temp 97.4°F | Resp 18 | Ht 66.0 in | Wt 160.4 lb

## 2011-06-10 DIAGNOSIS — C50419 Malignant neoplasm of upper-outer quadrant of unspecified female breast: Secondary | ICD-10-CM

## 2011-06-10 NOTE — Progress Notes (Signed)
Casey Wall    [id number] 06/10/2011    Oct 27, 1955   CC: Post op NL Right lumpectomy and SLN  HPI: The patient returns for post op follow-up. She underwent a Right lumpectomy and SLN thru one incision on 06/01/11. Over all she feels that she is doing well, except for numbness and pain around the axillary area   PE: The incision is healing nicely and there is no evidence of infection or hematoma.Ther is some tenderness and fullness in the axillary a.  DATA REVIEWED: Pathology report showed 1.7 cm cancer, margins OK, 1/2 LN +  IMPRESSION: Patient doing well. May be developing a seroma  PLAN: Her next visit will be in two weeks, but she will call if more pain in the axillary area Gave her path report and discussed with her.

## 2011-06-10 NOTE — Patient Instructions (Signed)
I will see you in two weeks, but if you have more discomfort in the armpit area call and I will see you next week

## 2011-06-13 ENCOUNTER — Ambulatory Visit: Payer: BC Managed Care – PPO

## 2011-06-13 ENCOUNTER — Ambulatory Visit (HOSPITAL_BASED_OUTPATIENT_CLINIC_OR_DEPARTMENT_OTHER): Payer: BC Managed Care – PPO | Admitting: Oncology

## 2011-06-13 ENCOUNTER — Other Ambulatory Visit: Payer: Self-pay | Admitting: *Deleted

## 2011-06-13 ENCOUNTER — Encounter: Payer: Self-pay | Admitting: Radiation Oncology

## 2011-06-13 VITALS — BP 119/75 | HR 76 | Temp 98.3°F | Ht 66.5 in | Wt 161.4 lb

## 2011-06-13 DIAGNOSIS — C50919 Malignant neoplasm of unspecified site of unspecified female breast: Secondary | ICD-10-CM

## 2011-06-13 DIAGNOSIS — C50419 Malignant neoplasm of upper-outer quadrant of unspecified female breast: Secondary | ICD-10-CM

## 2011-06-13 LAB — CBC WITH DIFFERENTIAL/PLATELET
BASO%: 0.8 % (ref 0.0–2.0)
Eosinophils Absolute: 0.1 10*3/uL (ref 0.0–0.5)
HCT: 35.3 % (ref 34.8–46.6)
LYMPH%: 37.4 % (ref 14.0–49.7)
MCHC: 34.3 g/dL (ref 31.5–36.0)
MCV: 90.5 fL (ref 79.5–101.0)
MONO#: 0.5 10*3/uL (ref 0.1–0.9)
MONO%: 10.6 % (ref 0.0–14.0)
NEUT%: 49.7 % (ref 38.4–76.8)
Platelets: 385 10*3/uL (ref 145–400)
WBC: 4.7 10*3/uL (ref 3.9–10.3)

## 2011-06-13 NOTE — Progress Notes (Signed)
56 year old. Female. Married.  Recent screening mammogram showed an area of asymmetry in the right breast that appeared to have increased in size since her prior mammogram. Ultrasound showed benign area but, radiologist suggested biopsy. 05/04/11 core biopsy proved invasive ductal carcinoma. Right breast lumpectomy and sentinel node biopsy done 06/01/2011 proved invasive well differentiated low grade ductal carcinoma with one lymph node positive for metastatic mammary carcinoma. ER, PR positive. Her 2- no amplification.   Patient scheduled to follow up with Dr. Jamey Ripa in two weeks.  NKDA No hx of radiation therapy  No indication of pacemaker

## 2011-06-13 NOTE — Progress Notes (Signed)
Referral MD Dr Ivor Reining; dr Burney Gauze  [phone]    Reason for Referral: Breast mass  No chief complaint on file. : Casey Wall had a  F/u screening mammogram in [place], as part of a short term f/u mammogram . This revealed a 1.4x 1.3 x 1.2 cm mass at the 10 o'clock position. A biopsy on 05/04/11 revealed an invasive ductal cancer , Grade 1. Er 100%, PR 100%, Ki67 8%, her2 -ve. An MRI on 05/09/11 showed a mass of 1 x 1.6x 1.4 cm. No associated enlarged lymph nodes were seen. Lumpectomy with sentinel node biopsy was performed on 06/01/11. Final pathology revealed 1.7 cm G1, tumor with clear surgical conditions. 1 of 2 sentinel lymph nodes was involved with a 0.8 cm focus of tumor.     HPI:   Past Medical History  Diagnosis Date  . GERD (gastroesophageal reflux disease)   . Constipation   . Breast cancer 04/2011    right  . Seasonal allergies   . Stomach ulcer   . History of fibrocystic disease of breast   . Anemia   . Back pain   :  Past Surgical History  Procedure Date  . Total abdominal hysterectomy with BSO  1997  . Cesarean section 1980  . Cervical discectomy 2007  . Breast lumpectomy 06/01/11    right  . Breast cyst aspirated   :  Current outpatient prescriptions:cetirizine (ZYRTEC) 10 MG tablet, Take 10 mg by mouth as needed. , Disp: , Rfl: ;  Cholecalciferol (VITAMIN D PO), Take 1,000 mg by mouth daily. , Disp: , Rfl: ;  fluticasone (FLONASE) 50 MCG/ACT nasal spray, Place 2 sprays into the nose as needed. , Disp: , Rfl: ;  Melatonin 1 MG CAPS, Take 1 capsule by mouth at bedtime as needed. Pt. Not sure of dose, Disp: , Rfl:  Polyethylene Glycol 3350 (MIRALAX PO), Take by mouth as needed.  , Disp: , Rfl: ;  Probiotic Product (SOLUBLE FIBER/PROBIOTICS PO), Take 1 capsule by mouth 3 (three) times daily., Disp: , Rfl: ;  RABEprazole (ACIPHEX) 20 MG tablet, Take 20 mg by mouth daily. AM , Disp: , Rfl: ;  sucralfate (CARAFATE) 1 G tablet, as needed. , Disp: , Rfl: ;   tretinoin microspheres (RETIN-A MICRO) 0.04 % gel, Apply topically at bedtime. , Disp: , Rfl:  estradiol (VIVELLE-DOT) 0.05 MG/24HR, Place 1 patch onto the skin once a week.  , Disp: , Rfl: ;  HYDROmorphone (DILAUDID) 4 MG tablet, , Disp: , Rfl: :    :  No Known Allergies:  Family History  Problem Relation Age of Onset  . Lung cancer Father   . Cancer Father     lung  . Diabetes Mother   :  History   Social History  . Marital Status: Married x 2 y , 1st marriage x 9 y    Spouse Name: Renae Fickle    Number of Children: 1   . Years of Education: N/A   Occupational History  . Works as Oncologist   Social History Main Topics  . Smoking status: Never Smoker   . Smokeless tobacco: Never Used  . Alcohol Use: Yes     occasionally  . Drug Use: No  . Sexually Active: Not on file   Reproductive  hx Concern  Menarche at 8, menopause at time of hysterectomy. On HRT since tah-bso.    Social History Narrative  . No narrative on file  :  A comprehensive review of systems was negative.  Exam: @IPVITALS @ General appearance: alert, cooperative and appears stated age Eyes: conjunctivae/corneas clear. PERRL, EOM's intact. Fundi benign. Throat: lips, mucosa, and tongue normal; teeth and gums normal Resp: clear to auscultation bilaterally and normal percussion bilaterally Breasts: normal appearance, S/p lumpectomy , well healed surgical scar, 3 cm mobile mass lt breast, no nipple retraction or axillary adenopathy. Cardio: regular rate and rhythm, S1, S2 normal, no murmur, click, rub or gallop and normal apical impulse GI: soft, non-tender; bowel sounds normal; no masses,  no organomegaly Extremities: extremities normal, atraumatic, no cyanosis or edema Lymph nodes: Cervical, supraclavicular, and axillary nodes normal. Neurologic: Grossly normal   Basename 06/13/11 1639  WBC 4.7  HGB 12.1  HCT 35.3  PLT 385     Blood smear review:   n/a Pathology:as above  Dg Chest 2 View  05/27/2011  *RADIOLOGY REPORT*  Clinical Data: Preoperative respiratory exam. Diagnosis of breast cancer.  CHEST - 2 VIEW  Comparison: Radiograph dated 03/29/2011  Findings: Heart size and vascularity are normal and the lungs are clear.  No osseous abnormality.  IMPRESSION: Normal chest.  Original Report Authenticated By: Gwynn Burly, M.D.   Nm Sentinel Node Inj-no Rpt (breast)  06/01/2011  CLINICAL DATA: breast cancer   Sulfur colloid was injected intradermally by the nuclear medicine  technologist for breast cancer sentinel node localization.     [place] Wire Localization Right  06/01/2011  *RADIOLOGY REPORT*  Clinical Data:  Recent diagnosis of right breast cancer.  The patient presents for wire localization.  NEEDLE LOCALIZATION USING ULTRASOUND GUIDANCE AND SPECIMEN RADIOGRAPH  Patient presents for needle localization prior to lumpectomy.  I met with the patient and we discussed the procedure of needle localization including risks, benefits, and alternatives. Specifically, we discussed the risks of infection, bleeding, tissue injury, and inadequate sampling.  Informed written consent was given.  Using ultrasound guidance, sterile technique, 2% lidocaine and a 7 cm modified Kopans needle, biopsy-proven carcinoma in the 10 o'clock position of the right breast approximately 15 cm from the nipple was localized using a lateral to medial approach.  The films are marked for Dr. Jamey Ripa.  Specimen radiograph is performed at [facility], and confirms the mass, biopsy clip, and an intact wire present in the tissue sample. The specimen is marked for pathology.  IMPRESSION: Needle localization right breast.  No apparent complications.  Original Report Authenticated By: Britta Mccreedy, M.D.   Mm Breast Surgical Specimen  06/01/2011  *RADIOLOGY REPORT*  Clinical Data:  Recent diagnosis of right breast cancer.  The patient presents for wire localization.  NEEDLE LOCALIZATION  USING ULTRASOUND GUIDANCE AND SPECIMEN RADIOGRAPH  Patient presents for needle localization prior to lumpectomy.  I met with the patient and we discussed the procedure of needle localization including risks, benefits, and alternatives. Specifically, we discussed the risks of infection, bleeding, tissue injury, and inadequate sampling.  Informed written consent was given.  Using ultrasound guidance, sterile technique, 2% lidocaine and a 7 cm modified Kopans needle, biopsy-proven carcinoma in the 10 o'clock position of the right breast approximately 15 cm from the nipple was localized using a lateral to medial approach.  The films are marked for Dr. Jamey Ripa.  Specimen radiograph is performed at Day [facility], and confirms the mass, biopsy clip, and an intact wire present in the tissue sample. The specimen is marked for pathology.  IMPRESSION: Needle localization right breast.  No apparent complications.  Original Report Authenticated By: Britta Mccreedy, M.D.  Assessment and Plan: Pleasant 56 yo woman with T1cN1 er+, G1 breast cancer . I spent 40 minutes discussing pathology and treatment options, including an enrollment in SWOG s10007. This is a randomized study randomizing pts with an intermediate oncotype score to hormonal therapy +/- chemo.  She has had a recent CT of abdomen/pelvis which showed possible hemangioma. I have recommended a dexa scan as well as an u/s and possible biopsy of the lt breast which appears to have a palpable mass. Off study , i would still recommend an oncotype test with likely hormone therapy alone for a low score. I will plan to see Casey Wall back in 2-3 weeks.

## 2011-06-14 ENCOUNTER — Ambulatory Visit
Admission: RE | Admit: 2011-06-14 | Discharge: 2011-06-14 | Disposition: A | Discharge status: Home / Self Care | Payer: BC Managed Care – PPO | Source: Ambulatory Visit | Attending: Radiation Oncology | Admitting: Radiation Oncology

## 2011-06-14 ENCOUNTER — Encounter: Payer: Self-pay | Admitting: *Deleted

## 2011-06-14 ENCOUNTER — Ambulatory Visit: Payer: BC Managed Care – PPO

## 2011-06-14 ENCOUNTER — Encounter: Payer: Self-pay | Admitting: Radiation Oncology

## 2011-06-14 ENCOUNTER — Ambulatory Visit: Payer: BC Managed Care – PPO | Admitting: Oncology

## 2011-06-14 ENCOUNTER — Ambulatory Visit: Payer: BC Managed Care – PPO | Admitting: Radiation Oncology

## 2011-06-14 ENCOUNTER — Other Ambulatory Visit: Payer: BC Managed Care – PPO | Admitting: Lab

## 2011-06-14 VITALS — BP 108/73 | HR 69 | Temp 98.8°F | Resp 18 | Ht 66.0 in | Wt 161.4 lb

## 2011-06-14 DIAGNOSIS — C50419 Malignant neoplasm of upper-outer quadrant of unspecified female breast: Secondary | ICD-10-CM

## 2011-06-14 DIAGNOSIS — K219 Gastro-esophageal reflux disease without esophagitis: Secondary | ICD-10-CM | POA: Insufficient documentation

## 2011-06-14 DIAGNOSIS — Z510 Encounter for antineoplastic radiation therapy: Secondary | ICD-10-CM | POA: Insufficient documentation

## 2011-06-14 DIAGNOSIS — R5381 Other malaise: Secondary | ICD-10-CM | POA: Insufficient documentation

## 2011-06-14 DIAGNOSIS — Z809 Family history of malignant neoplasm, unspecified: Secondary | ICD-10-CM | POA: Insufficient documentation

## 2011-06-14 DIAGNOSIS — R42 Dizziness and giddiness: Secondary | ICD-10-CM | POA: Insufficient documentation

## 2011-06-14 DIAGNOSIS — Z79899 Other long term (current) drug therapy: Secondary | ICD-10-CM | POA: Insufficient documentation

## 2011-06-14 DIAGNOSIS — R110 Nausea: Secondary | ICD-10-CM | POA: Insufficient documentation

## 2011-06-14 LAB — COMPREHENSIVE METABOLIC PANEL
Alkaline Phosphatase: 67 U/L (ref 39–117)
CO2: 29 mEq/L (ref 19–32)
Creatinine, Ser: 0.72 mg/dL (ref 0.50–1.10)
Glucose, Bld: 70 mg/dL (ref 70–99)
Total Bilirubin: 0.2 mg/dL — ABNORMAL LOW (ref 0.3–1.2)

## 2011-06-14 NOTE — Progress Notes (Signed)
Completed NUTRITION RISK SCREEN worksheet without concerns turned into Zenovia Jarred, RD. Complete PATIENT MEASURE OF DISTRESS worksheet with a score of 10 turned into social work. Patient denies need to see social work today.

## 2011-06-14 NOTE — Progress Notes (Signed)
Mailed after appt letter to pt. 

## 2011-06-14 NOTE — Progress Notes (Signed)
Encounter addended by: Ardell Isaacs on: 06/14/2011  5:04 PM<BR>     Documentation filed: Notes Section

## 2011-06-14 NOTE — Progress Notes (Signed)
Encounter addended by: Ardell Isaacs on: 06/14/2011  5:21 PM<BR>     Documentation filed: Charges VN

## 2011-06-14 NOTE — Progress Notes (Signed)
[facility] Radiation Oncology NEW PATIENT EVALUATION  Name: Casey Wall MRN: [id number]  Date: 06/14/2011  DOB: 15-Jun-1955  Status: outpatient   CC: Georgann Housekeeper, MD, MD,  Dr. Cicero Duck, Dr. Pierce Crane   REFERRING PHYSICIAN: Dr. Cicero Duck  DIAGNOSIS: Pathologic stage IIA (T1, N1, M0) invasive ductal carcinoma of the right breast    HISTORY OF PRESENT ILLNESS:  Casey Wall is a 56 y.o. female who is seen today for the courtesy of Dr. Jamey Ripa for discussion of radiation therapy following conservative surgery in the management of her pathologic stage IIA (T1, N1, M0) invasive ductal carcinoma the right breast. She had a screening mammogram in [place] showing an area of asymmetry on spot compression views within the axillary region of the right breast. On ultrasound there was a nonspecific mass measured 1.2 cm.  She was evaluated further at the [facility] where she was noted have a 1.4 x 1.3 cm x 1.2 cm irregular hypoechoic mass within the right breast at 10:00, 15 cm from the nipple. This corresponded to a palpable abnormality. A biopsy on December 5 was diagnostic for invasive carcinoma with associated calcifications. The tumor was strongly ER/PR positive with a low proliferation marker of 8%. The tumor was HER-2/neu negative. Breast MRI on 05/09/2011 showed a solitary enhancing mass within the upper-outer quadrant of the right breast with multiple subcentimeter lesions within liver are felt to represent cysts. There is nothing abnormal seen in the left breast. The patient was seen by Dr. Jamey Ripa and she  had a right partial mastectomy with a sentinel lymph node biopsy on 06/01/2011. She had a low-grade ductal carcinoma measuring 1.7 cm with the closest margin being 0.9 cm. There was  LV I. One of 2 sentinel lymph nodes contained metastatic disease with a deposit measuring 0.8 cm. There was no extracapsular extension. She states that she noted a mass along the  upper-outer quadrant of the left breast, perhaps as early as December 2012. This was not brought to the attention of her physicians. The patient was seen in consultation by Dr. Pierce Crane who is considering placing her on the [facility] S10007 protocol randomizing patients with an intermediate Oncotype DX score to hormone therapy plus or minus chemotherapy. She is said to have had a recent CT scan of the abdomen/pelvis showing "possible hemangioma" but I do not see this in the [facility]. She has an appointment to see Dr. Donnie Coffin back on January 31 following completion of her Oncotype DX testing. She has an appointment to see Dr. Jamey Ripa back on January 25.     PREVIOUS RADIATION THERAPY: She radiation to her right ear lobe over 30 years ago for a keloid.   PAST MEDICAL HISTORY:  has a past medical history of Constipation; Breast cancer (04/2011); Seasonal allergies; History of fibrocystic disease of breast; Anemia; Stomach ulcer (2012); and GERD (gastroesophageal reflux disease) (2012).     PAST SURGICAL HISTORY:  Past Surgical History  Procedure Date  . Total abdominal hysterectomy 1997  . Cesarean section 1980  . Cervical discectomy 2007  . Breast lumpectomy 06/01/11    right  . Breast cyst aspirated   . Herniated disc surgery 2007    neck     FAMILY HISTORY: family history includes Cancer in her father and other; Diabetes in her mother; and Lung cancer in her father. Her father died from lung cancer at age 73. Her mother is living age 10.   SOCIAL HISTORY:  reports that she has never smoked. She has never used smokeless tobacco. She reports that she drinks alcohol. She reports that she does not use illicit drugs. Married one child. She works in Presenter, broadcasting for [employer].   ALLERGIES: Review of patient's allergies indicates no known allergies.   MEDICATIONS:  Current Outpatient Prescriptions  Medication Sig Dispense Refill  . cetirizine (ZYRTEC) 10 MG tablet Take 10  mg by mouth as needed.       . Cholecalciferol (VITAMIN D PO) Take 1,000 mg by mouth daily.       . fluticasone (FLONASE) 50 MCG/ACT nasal spray Place 2 sprays into the nose as needed.       . Melatonin 1 MG CAPS Take 1 capsule by mouth at bedtime as needed. Pt. Not sure of dose      . Polyethylene Glycol 3350 (MIRALAX PO) Take by mouth as needed.        . Probiotic Product (SOLUBLE FIBER/PROBIOTICS PO) Take 1 capsule by mouth 3 (three) times daily.      . RABEprazole (ACIPHEX) 20 MG tablet Take 20 mg by mouth as needed. AM      . tretinoin microspheres (RETIN-A MICRO) 0.04 % gel Apply topically at bedtime.       Marland Kitchen HYDROmorphone (DILAUDID) 4 MG tablet       . sucralfate (CARAFATE) 1 G tablet as needed.           REVIEW OF SYSTEMS:  Pertinent items are noted in HPI.    PHYSICAL EXAM:  height is 5\' 6"  (1.676 m) and weight is 161 lb 6.4 oz (73.211 kg). Her oral temperature is 98.8 F (37.1 C). Her blood pressure is 108/73 and her pulse is 69. Her respiration is 18.   Head and neck examination: Grossly unremarkable. Nodes: Without palpable cervical, supraclavicular, or axillary lymphadenopathy. Chest: Lungs clear. Back: Without spinal or CVA tenderness. Heart: Regular rate and rhythm. Breasts: There is a partial mastectomy wound along the axillary region of the right breast from 10 to 11:00. The wound is healing well. No dominant masses are appreciated. On palpation of the left breast there is a 2 cm mass at approximately 2:00. This is mobile. Abdomen without hepatomegaly. Extremities: Without edema.  LABORATORY DATA:  Lab Results  Component Value Date   WBC 4.7 06/13/2011   HGB 12.1 06/13/2011   HCT 35.3 06/13/2011   MCV 90.5 06/13/2011   PLT 385 06/13/2011   Lab Results  Component Value Date   NA 139 06/13/2011   K 3.8 06/13/2011   CL 102 06/13/2011   CO2 29 06/13/2011   Lab Results  Component Value Date   ALT 15 06/13/2011   AST 23 06/13/2011   ALKPHOS 67 06/13/2011   BILITOT 0.2*  06/13/2011      IMPRESSION: Pathologic stage IIA (T1, N1, M0) invasive ductal carcinoma of the right breast. I explained to the patient and her family that her local treatment options include mastectomy or partial mastectomy followed by radiation therapy. We also discussed radiation fractionation, and based on her breast size I would lean towards standard fractionation. The Z 11 trial suggest that radiation therapy in this setting may take the place of a completion axillary dissection in terms of local control and cure. We discussed the potential acute and late toxicities of radiation therapy and consent was signed today. I will contact Dr. Jamey Ripa regarding her left breast mass which, according to the patient, was present during her MRI scan.  I wonder if she has a left breast cyst. In any case, she needs further investigation with an ultrasound and probable left breast biopsy. In the meantime, we will await the results of her Oncotype DX testing to see if she is a candidate for the SWOG protocol through Dr. Donnie Coffin.   PLAN: As discussed above. Dr. Jamey Ripa will evaluate her left breast mass in the near future. I asked the Dr. Donnie Coffin contact me after he determines whether or not she is a candidate for the SWOG protocol.   I spent 60 minutes minutes face to face with the patient and more than 50% of that time was spent in counseling and/or coordination of care.

## 2011-06-14 NOTE — Progress Notes (Signed)
Please see the Nurse Progress Note in the MD Initial Consult Encounter for this patient. 

## 2011-06-14 NOTE — Progress Notes (Signed)
Patient presents to the clinic today accompanied by her husband and step daughter. Patient is alert and oriented to person, place, and time. No distress noted. Steady gait noted. Pleasant affect noted. Patient denies pain at this time. Patient reports her right breast is tender related to the effects of surgery. Patient reports that she had radiation therapy 30 years ago for a keloid on her right ear while in [place]. Patient reports that she woke up around 0300 dizzy. Patient reports that her appetite has improved slowly since her stomach virus. Patient reports frequent hot flashes. Patient reports that 4 vials of blood were drawn yesterday for oncotype testing. No date has been set for dexa scan. Patient scheduled to follow up with Dr. Jamey Ripa on 06/24/11 reference lump in opposite breast. Patient scheduled to follow up with Dr. Donnie Coffin on 06/30/2011. All findings reported to Dr. Dayton Scrape.

## 2011-06-15 NOTE — Progress Notes (Signed)
Encounter addended by: Ardell Isaacs on: 06/15/2011 12:45 PM<BR>     Documentation filed: Inpatient Patient Education

## 2011-06-16 ENCOUNTER — Telehealth: Payer: Self-pay | Admitting: *Deleted

## 2011-06-16 DIAGNOSIS — N63 Unspecified lump in unspecified breast: Secondary | ICD-10-CM

## 2011-06-16 DIAGNOSIS — E559 Vitamin D deficiency, unspecified: Secondary | ICD-10-CM

## 2011-06-16 DIAGNOSIS — C50419 Malignant neoplasm of upper-outer quadrant of unspecified female breast: Secondary | ICD-10-CM

## 2011-06-16 NOTE — Telephone Encounter (Signed)
Na

## 2011-06-20 ENCOUNTER — Encounter: Payer: Self-pay | Admitting: Specialist

## 2011-06-20 NOTE — Progress Notes (Signed)
I spoke with patient on the phone.  She inquired about available support programs and indicated she is needing support in dealing with her diagnosis of breast cancer.  I told patient about the Breast Cancer Support Group, the availability of counseling in the Patient Support Center, and about Reach to Recovery.  She requested a Reach referral, however, patient's treatment plan will need to be confirmed before that referral can be made.

## 2011-06-21 ENCOUNTER — Encounter: Payer: Self-pay | Admitting: *Deleted

## 2011-06-21 NOTE — Progress Notes (Signed)
Ordered Oncotype Dx w/ [facility] & faxed request to Pathology on 06/20/11.  Faxed PAC to [facility] 06/21/11.

## 2011-06-23 ENCOUNTER — Encounter: Payer: Self-pay | Admitting: Specialist

## 2011-06-23 ENCOUNTER — Other Ambulatory Visit: Payer: Self-pay | Admitting: Physician Assistant

## 2011-06-23 NOTE — Progress Notes (Signed)
I spoke with the patient on the phone. She called to follow-up on a conversation we had regarding a referral for a Reach to Recovery volunteer.  In the process of that conversation, she described having gone off hormones she had been on since a hysterectomy in her '30's.  She indicated she is not sleeping, having very bad night sweats, and is experiencing mood swings.  I spoke with Debbora Presto, Dr. Renelda Loma PA, who said she would contact the patient to discuss her symptoms with her.

## 2011-06-24 ENCOUNTER — Encounter (INDEPENDENT_AMBULATORY_CARE_PROVIDER_SITE_OTHER): Payer: Self-pay | Admitting: Surgery

## 2011-06-24 ENCOUNTER — Ambulatory Visit (INDEPENDENT_AMBULATORY_CARE_PROVIDER_SITE_OTHER): Payer: BC Managed Care – PPO | Admitting: Surgery

## 2011-06-24 ENCOUNTER — Other Ambulatory Visit: Payer: Self-pay

## 2011-06-24 VITALS — BP 134/78 | HR 70 | Temp 97.9°F | Resp 18 | Ht 66.0 in | Wt 162.4 lb

## 2011-06-24 DIAGNOSIS — Z09 Encounter for follow-up examination after completed treatment for conditions other than malignant neoplasm: Secondary | ICD-10-CM

## 2011-06-24 MED ORDER — VENLAFAXINE HCL ER 37.5 MG PO CP24: 37.5000 mg | ORAL_CAPSULE | Freq: Every day | ORAL | Status: DC

## 2011-06-24 MED ORDER — LORAZEPAM 0.5 MG PO TABS: 0.5000 mg | ORAL_TABLET | Freq: Three times a day (TID) | ORAL | Status: AC

## 2011-06-24 NOTE — Patient Instructions (Signed)
See me in about three weeks. Ask Dr Donnie Coffin about meds to help with hot flashes. Go to the ABC class

## 2011-06-24 NOTE — Progress Notes (Signed)
Casey Wall    [id number] 06/24/2011    1955-07-27   CC: Post op right lumpectomy and SLN  HPI: The patient returns for post op follow-up. She underwent a surgery on 06/01/11. Over all she feels that she is doing well. She has nted some cording in the axilla and that is a little painful She also points out a mass in the left breast , My recollection was that I thought this was a cyst at her initialvisit, but did not comment on it on the PE  PE: The incision is healing nicely and there is no evidence of infection or hematoma.She does have some cording developing.There is a two cm well circumscribed mass Left breast upper outer quadrant.  DATA REVIEWED: Her MRI showed multiple breast cysts. I did an ultrasound today and this appears to be a cyst  IMPRESSION: Patient doing well. Left breast cyst. Mild cording from surgery  PLAN: Her next visit will be in three weeks. Gave her ABC and PT info.Marland Kitchen

## 2011-06-28 ENCOUNTER — Ambulatory Visit
Admission: RE | Admit: 2011-06-28 | Discharge: 2011-06-28 | Disposition: A | Discharge status: Home / Self Care | Payer: BC Managed Care – PPO | Source: Ambulatory Visit | Attending: Oncology | Admitting: Oncology

## 2011-06-28 DIAGNOSIS — E559 Vitamin D deficiency, unspecified: Secondary | ICD-10-CM

## 2011-06-28 DIAGNOSIS — N63 Unspecified lump in unspecified breast: Secondary | ICD-10-CM

## 2011-06-30 ENCOUNTER — Telehealth: Payer: Self-pay | Admitting: Oncology

## 2011-06-30 ENCOUNTER — Ambulatory Visit (HOSPITAL_BASED_OUTPATIENT_CLINIC_OR_DEPARTMENT_OTHER): Payer: BC Managed Care – PPO | Admitting: Oncology

## 2011-06-30 ENCOUNTER — Other Ambulatory Visit (HOSPITAL_BASED_OUTPATIENT_CLINIC_OR_DEPARTMENT_OTHER): Payer: BC Managed Care – PPO | Admitting: Lab

## 2011-06-30 VITALS — BP 125/72 | HR 80 | Temp 98.6°F | Ht 66.0 in | Wt 162.4 lb

## 2011-06-30 DIAGNOSIS — E559 Vitamin D deficiency, unspecified: Secondary | ICD-10-CM

## 2011-06-30 DIAGNOSIS — C50419 Malignant neoplasm of upper-outer quadrant of unspecified female breast: Secondary | ICD-10-CM

## 2011-06-30 DIAGNOSIS — C50919 Malignant neoplasm of unspecified site of unspecified female breast: Secondary | ICD-10-CM

## 2011-06-30 NOTE — Telephone Encounter (Signed)
gve the pt her march 2013 appt calendar 

## 2011-06-30 NOTE — Progress Notes (Signed)
Referral MD Dr Ivor Reining; dr Burney Gauze  [phone]    Reason for Referral: Breast mass  No chief complaint on file. : Ms Woodbeck had a  F/u screening mammogram in [place], as part of a short term f/u mammogram . This revealed a 1.4x 1.3 x 1.2 cm mass at the 10 o'clock position. A biopsy on 05/04/11 revealed an invasive ductal cancer , Grade 1. Er 100%, PR 100%, Ki67 8%, her2 -ve. An MRI on 05/09/11 showed a mass of 1 x 1.6x 1.4 cm. No associated enlarged lymph nodes were seen. Lumpectomy with sentinel node biopsy was performed on 06/01/11. Final pathology revealed 1.7 cm G1, tumor with clear surgical conditions. 1 of 2 sentinel lymph nodes was involved with a 0.8 cm focus of tumor.     HPI:   Past Medical History  Diagnosis Date  . GERD (gastroesophageal reflux disease)   . Constipation   . Breast cancer 04/2011    right  . Seasonal allergies   . Stomach ulcer   . History of fibrocystic disease of breast   . Anemia   . Back pain   :  Past Surgical History  Procedure Date  . Total abdominal hysterectomy with BSO  1997  . Cesarean section 1980  . Cervical discectomy 2007  . Breast lumpectomy 06/01/11    right  . Breast cyst aspirated   :  Current outpatient prescriptions:cetirizine (ZYRTEC) 10 MG tablet, Take 10 mg by mouth as needed. , Disp: , Rfl: ;  Cholecalciferol (VITAMIN D PO), Take 1,000 mg by mouth daily. , Disp: , Rfl: ;  fluticasone (FLONASE) 50 MCG/ACT nasal spray, Place 2 sprays into the nose as needed. , Disp: , Rfl: ;  Melatonin 1 MG CAPS, Take 1 capsule by mouth at bedtime as needed. Pt. Not sure of dose, Disp: , Rfl:  Polyethylene Glycol 3350 (MIRALAX PO), Take by mouth as needed.  , Disp: , Rfl: ;  Probiotic Product (SOLUBLE FIBER/PROBIOTICS PO), Take 1 capsule by mouth 3 (three) times daily., Disp: , Rfl: ;  RABEprazole (ACIPHEX) 20 MG tablet, Take 20 mg by mouth daily. AM , Disp: , Rfl: ;  sucralfate (CARAFATE) 1 G tablet, as needed. , Disp: , Rfl: ;   tretinoin microspheres (RETIN-A MICRO) 0.04 % gel, Apply topically at bedtime. , Disp: , Rfl:  estradiol (VIVELLE-DOT) 0.05 MG/24HR, Place 1 patch onto the skin once a week.  , Disp: , Rfl: ;  HYDROmorphone (DILAUDID) 4 MG tablet, , Disp: , Rfl: :    :  No Known Allergies:  Family History  Problem Relation Age of Onset  . Lung cancer Father   . Cancer Father     lung  . Diabetes Mother   :  History   Social History  . Marital Status: Married x 2 y , 1st marriage x 9 y    Spouse Name: Renae Fickle    Number of Children: 1   . Years of Education: N/A   Occupational History  . Works as Oncologist   Social History Main Topics  . Smoking status: Never Smoker   . Smokeless tobacco: Never Used  . Alcohol Use: Yes     occasionally  . Drug Use: No  . Sexually Active: Not on file   Reproductive  hx Concern  Menarche at 8, menopause at time of hysterectomy. On HRT since tah-bso.    Social History Narrative  . No narrative on file  :  A comprehensive review of systems was negative.  Exam: @IPVITALS @ General appearance: alert, cooperative and appears stated age Eyes: conjunctivae/corneas clear. PERRL, EOM's intact. Fundi benign. Throat: lips, mucosa, and tongue normal; teeth and gums normal Resp: clear to auscultation bilaterally and normal percussion bilaterally Breasts: normal appearance, S/p lumpectomy , well healed surgical scar, 3 cm mobile mass lt breast, no nipple retraction or axillary adenopathy. Cardio: regular rate and rhythm, S1, S2 normal, no murmur, click, rub or gallop and normal apical impulse GI: soft, non-tender; bowel sounds normal; no masses,  no organomegaly Extremities: extremities normal, atraumatic, no cyanosis or edema Lymph nodes: Cervical, supraclavicular, and axillary nodes normal. Neurologic: Grossly normal   Basename 06/13/11 1639  WBC 4.7  HGB 12.1  HCT 35.3  PLT 385     Blood smear review:   n/a Pathology:as above  Dg Chest 2 View  05/27/2011  *RADIOLOGY REPORT*  Clinical Data: Preoperative respiratory exam. Diagnosis of breast cancer.  CHEST - 2 VIEW  Comparison: Radiograph dated 03/29/2011  Findings: Heart size and vascularity are normal and the lungs are clear.  No osseous abnormality.  IMPRESSION: Normal chest.  Original Report Authenticated By: Gwynn Burly, M.D.   Nm Sentinel Node Inj-no Rpt (breast)  06/01/2011  CLINICAL DATA: breast cancer   Sulfur colloid was injected intradermally by the nuclear medicine  technologist for breast cancer sentinel node localization.     [place] Wire Localization Right  06/01/2011  *RADIOLOGY REPORT*  Clinical Data:  Recent diagnosis of right breast cancer.  The patient presents for wire localization.  NEEDLE LOCALIZATION USING ULTRASOUND GUIDANCE AND SPECIMEN RADIOGRAPH  Patient presents for needle localization prior to lumpectomy.  I met with the patient and we discussed the procedure of needle localization including risks, benefits, and alternatives. Specifically, we discussed the risks of infection, bleeding, tissue injury, and inadequate sampling.  Informed written consent was given.  Using ultrasound guidance, sterile technique, 2% lidocaine and a 7 cm modified Kopans needle, biopsy-proven carcinoma in the 10 o'clock position of the right breast approximately 15 cm from the nipple was localized using a lateral to medial approach.  The films are marked for Dr. Jamey Ripa.  Specimen radiograph is performed at [facility], and confirms the mass, biopsy clip, and an intact wire present in the tissue sample. The specimen is marked for pathology.  IMPRESSION: Needle localization right breast.  No apparent complications.  Original Report Authenticated By: Britta Mccreedy, M.D.   Mm Breast Surgical Specimen  06/01/2011  *RADIOLOGY REPORT*  Clinical Data:  Recent diagnosis of right breast cancer.  The patient presents for wire localization.  NEEDLE LOCALIZATION  USING ULTRASOUND GUIDANCE AND SPECIMEN RADIOGRAPH  Patient presents for needle localization prior to lumpectomy.  I met with the patient and we discussed the procedure of needle localization including risks, benefits, and alternatives. Specifically, we discussed the risks of infection, bleeding, tissue injury, and inadequate sampling.  Informed written consent was given.  Using ultrasound guidance, sterile technique, 2% lidocaine and a 7 cm modified Kopans needle, biopsy-proven carcinoma in the 10 o'clock position of the right breast approximately 15 cm from the nipple was localized using a lateral to medial approach.  The films are marked for Dr. Jamey Ripa.  Specimen radiograph is performed at [facility], and confirms the mass, biopsy clip, and an intact wire present in the tissue sample. The specimen is marked for pathology.  IMPRESSION: Needle localization right breast.  No apparent complications.  Original Report Authenticated By: Britta Mccreedy, M.D.  Assessment and Plan: Pleasant 56 yo woman with T1cN1 er+, G1 breast cancer . We still await results from the oncotype test.Her bone density test is wnl. I indicated to her that if her oncotype score was in the intermediate range I would recommend chemotherapy. Apparently the lesion in the contralateral breast was felt to be a cyst.  I will make  Ms Stephenson aware of the results when they come back.  Pierce Crane MD

## 2011-07-01 LAB — VITAMIN D 25 HYDROXY (VIT D DEFICIENCY, FRACTURES): Vit D, 25-Hydroxy: 34 ng/mL (ref 30–89)

## 2011-07-04 ENCOUNTER — Telehealth (INDEPENDENT_AMBULATORY_CARE_PROVIDER_SITE_OTHER): Payer: Self-pay

## 2011-07-04 ENCOUNTER — Encounter (INDEPENDENT_AMBULATORY_CARE_PROVIDER_SITE_OTHER): Payer: Self-pay

## 2011-07-04 NOTE — Telephone Encounter (Signed)
Pt  Called stating she went to [facility] class and was given instructions for decreased ROM. Pt states she had a tight band under operative axilla that restricted her ability to raise her arm all the way up. She states this is what she went to [facility] class for.  Pt states she forgot and reached above her head this weekend and felt at pull in area of the band. This left slight bruising where the tight band had been and a little soreness. Pt is to ice area and use advil to help with any discomfort. Pt advised to gently resume ABC exercises so area will not restricture. Pt to call if swelling or increased bruising occur. Pt states she will.

## 2011-07-12 ENCOUNTER — Encounter (INDEPENDENT_AMBULATORY_CARE_PROVIDER_SITE_OTHER): Payer: Self-pay | Admitting: Surgery

## 2011-07-13 ENCOUNTER — Telehealth (INDEPENDENT_AMBULATORY_CARE_PROVIDER_SITE_OTHER): Payer: Self-pay | Admitting: General Surgery

## 2011-07-13 NOTE — Telephone Encounter (Signed)
Pt states she has pain periodically through her breast and occasional heat to the touch, she denies any redness or fever. Consulted with Lesly Rubenstein and advised the patient it sounds like the healing process pt is advised to call if she shows any sign of infection or desire to be seen.

## 2011-07-15 ENCOUNTER — Telehealth: Payer: Self-pay | Admitting: Oncology

## 2011-07-15 NOTE — Telephone Encounter (Signed)
Pt called to inquire about Oncotype results.  I did not have when she called- and I have the results now.  I tried to call the patient back but no answer.  I left a message letting her know that Dr. Donnie Coffin has seen the results and tried to call her.   I asked her to return my call.

## 2011-07-18 ENCOUNTER — Telehealth: Payer: Self-pay | Admitting: Oncology

## 2011-07-18 NOTE — Telephone Encounter (Signed)
I called the patient again to report her oncotype score- and reached her!   She stated her phone is "messing up" and she did receive my messages.    Her oncotype score is low and Dr. Donnie Coffin referred her back to Dr. Dayton Scrape so I emailed Otis Peak to schedule her.   Oncotype score is 14.

## 2011-07-20 ENCOUNTER — Ambulatory Visit (INDEPENDENT_AMBULATORY_CARE_PROVIDER_SITE_OTHER): Payer: BC Managed Care – PPO | Admitting: Surgery

## 2011-07-20 ENCOUNTER — Encounter (INDEPENDENT_AMBULATORY_CARE_PROVIDER_SITE_OTHER): Payer: Self-pay | Admitting: Surgery

## 2011-07-20 VITALS — BP 114/72 | HR 68 | Temp 97.3°F | Resp 16 | Ht 67.0 in | Wt 160.2 lb

## 2011-07-20 DIAGNOSIS — Z9889 Other specified postprocedural states: Secondary | ICD-10-CM

## 2011-07-20 NOTE — Progress Notes (Signed)
Casey Wall    [id number] 07/20/2011    [birth year]   CC: Post op right lumpectomy and SLN  HPI: The patient returns for post op follow-up. She underwent a surgery on 06/01/11. Over all she feels that she is doing well. The cording has improved  PE: The incision is healing nicely and there is no evidence of infection or hematoma.She does have minimal cording.  DATA REVIEWED: Hwer recurrence score was low at 14  IMPRESSION: Patient doing well. Left breast cyst. Mild cording from surgery  PLAN: Her next visit will be in two months, after radiation.

## 2011-07-20 NOTE — Patient Instructions (Signed)
Continued to do your arm exercises. See me in about two months after you have finished your radiation therapy treatments.

## 2011-07-21 ENCOUNTER — Ambulatory Visit
Admission: RE | Admit: 2011-07-21 | Discharge: 2011-07-21 | Disposition: A | Discharge status: Home / Self Care | Payer: BC Managed Care – PPO | Source: Ambulatory Visit | Attending: Radiation Oncology | Admitting: Radiation Oncology

## 2011-07-21 ENCOUNTER — Encounter: Payer: Self-pay | Admitting: Radiation Oncology

## 2011-07-21 VITALS — BP 102/66 | HR 80 | Temp 97.7°F | Resp 18 | Ht 67.0 in | Wt 162.0 lb

## 2011-07-21 DIAGNOSIS — C50419 Malignant neoplasm of upper-outer quadrant of unspecified female breast: Secondary | ICD-10-CM

## 2011-07-21 NOTE — Progress Notes (Signed)
Patient presents to the clinic today accompanied by her husband for a follow up new consult with Dr. Dayton Scrape. Patient is alert and oriented to person, place, and time. No distress noted. Steady gait noted. Pleasant affect noted. Patient denies pain presently. Patient reports intermittent brief sharp shooting pain 10 on a scale of 0-10 in her right breast. Patient reports that her nipple is very tender. Patient reports she has noted some irritation under her right breast. Patient denies nausea, vomiting, headache, diarrhea, or dizziness. Reported all findings to Dr. Dayton Scrape.

## 2011-07-21 NOTE — Progress Notes (Signed)
Followup note:  Pathologic stage II A (T1, N1, M0) invasive ductal carcinoma of the right breast  Ms. Cassaro returns today for review and scheduling of her radiation therapy. I saw her at the [facility] 15 2013. She presented with a 1.4 x 1.3 x 1.2 cm mass within the right breast at 10:00 15 cm from the nipple. Biopsy on December 5 was diagnostic for invasive carcinoma with calcifications. The tumor strongly ER/PR positive with a low proliferation marker of 8%. Breast MRI on December 10 showed a solitary enhancing mass within the upper outer quadrant of the right breast. On MR there is no axillary or intramammary lymphadenopathy. On 06/01/2011 she underwent a right partial cystectomy was then have a 1.7 cm invasive well-differentiated low-grade ductal carcinoma. LV I. was identified and the tumor was 0.9 cm from the nearest margin (anterior). One of 2 sentinel lymph nodes contained a 0.8 cm metastatic deposit. She was being considered for the SWOG protocol randomizing patients with intermediate Oncotype DX course to hormone therapy plus or minus chemotherapy. I stated that her score was found to be 14. She is not being offered chemotherapy. She is without complaints today. Dr. Donnie Coffin plans on giving her hormone therapy following completion of radiation therapy.  Physical examination: Head and neck examination grossly unremarkable. Nodes: Without palpable cervical, supraclavicular, or axillary lymphadenopathy. Chest: Lungs clear. Heart: Regular in rhythm. Breasts: There is a partial mastectomy wound along the upper-outer quadrant of the right breast. No masses are appreciated. Left breast without masses or lesions. Abdomen without hepatomegaly. Extremities without edema.  Impression: Stage IIA (T1, N1, M0) invasive ductal carcinoma the right breast. I explained to the patient and her husband that her local treatment options include mastectomy versus partial mastectomy followed by radiation therapy. She  desires breast preservation. Based on the Z 11 trial she does not need an axillary dissection, and she'll be treated with either high tangents to cover the upper axilla or utilizing separate supraclavicular field depending on her anatomy. I discussed the potential acute and late toxicities of radiation therapy and she wishes to proceed as outlined. Consent was previously signed on January 15. I'll have her return for treatment planning early next week.  30 minutes was spent face-to-face with the patient, primarily counseling the patient.

## 2011-07-21 NOTE — Progress Notes (Signed)
Complete NUTRITION RISK SCREEN worksheet without concerns submitted to Zenovia Jarred, RD. Also, complete PATIENT MEASURE OF DISTRESS  Worksheet with a score of 4 submitted to social work.

## 2011-07-21 NOTE — Progress Notes (Signed)
Encounter addended by: Ardell Isaacs, RN on: 07/21/2011  1:16 PM<BR>     Documentation filed: Charges VN, Inpatient Patient Education

## 2011-07-21 NOTE — Progress Notes (Signed)
Please see the Nurse Progress Note in the MD Initial Consult Encounter for this patient. 

## 2011-07-25 ENCOUNTER — Telehealth: Payer: Self-pay | Admitting: *Deleted

## 2011-07-25 ENCOUNTER — Telehealth: Payer: Self-pay | Admitting: Radiation Oncology

## 2011-07-25 ENCOUNTER — Encounter: Payer: Self-pay | Admitting: *Deleted

## 2011-07-25 ENCOUNTER — Telehealth: Payer: Self-pay | Admitting: Oncology

## 2011-07-25 NOTE — Progress Notes (Signed)
Clinical Child psychotherapist received referral from [employer] for emotional support.  CSW contacted pt to asses for needs.  Pt is aware of some resources at [facility], but would like more information.  CSW and pt are scheduled to meet before her appointment on 2/26.    Tamala Julian, MSW, LCSW Clinical Social Worker [facility] [phone]

## 2011-07-25 NOTE — Telephone Encounter (Signed)
Pt. Called.  She is due to have an endoscopy at [facility] by Dr. Noe Gens in March for a possible ulcer.   She also starts radiation therapy for her breast cancer in March, under Dr. Dayton Scrape.  Encouraged her to have these two physicians talk with each other, as the two things can definitely effect each other.  Stressed to her that it is important for them to be in communication about this.   Pt. Verbalized understanding and she will discuss this with both physicians.

## 2011-07-25 NOTE — Telephone Encounter (Signed)
Patient called to question if she should delay her endoscopy of 08/19/2011 until radiation therapy is complete. Phoned patient back and instructed her to go forward with the endoscopy as scheduled and skip radiation treatment that day per Dr. Dayton Scrape. Encouraged patient to remind radiation therapist of this event. Patient verbalized understanding.

## 2011-07-25 NOTE — Telephone Encounter (Signed)
I returned the patient's call.   She called to inquire about her plan of care- because she was not sure that her physician team was aware of her oncotype score.   I discussed with Dr. Dayton Scrape who will see her tomorrow and with Dr. Donnie Coffin who will try to call her again.   I also referred her to our patient and family support team because she expressed anxiety about her diagnosis.    Pt expressed understanding about her plan.

## 2011-07-26 ENCOUNTER — Ambulatory Visit
Admission: RE | Admit: 2011-07-26 | Discharge: 2011-07-26 | Disposition: A | Discharge status: Home / Self Care | Payer: BC Managed Care – PPO | Source: Ambulatory Visit | Attending: Radiation Oncology | Admitting: Radiation Oncology

## 2011-07-26 ENCOUNTER — Encounter: Payer: Self-pay | Admitting: *Deleted

## 2011-07-26 DIAGNOSIS — C50419 Malignant neoplasm of upper-outer quadrant of unspecified female breast: Secondary | ICD-10-CM

## 2011-07-26 NOTE — Progress Notes (Signed)
Simulation/treatment planning note:  The patient was taken to the CT simulator. A custom neck mold was constructed on a breast board for immobilization. Her right partial mastectomy scar was marked with a radiopaque wire. Her field borders were also marked. She was then scanned. I contoured her tumor bed and also partial mastectomy scar. She was set up to medial and lateral high breast tangents to cover the axilla. 2 separate multileaf collimators were designed to conform the field. I prescribing 5040 cGy in 28 sessions utilizing mixed energy photons. This was followed by electron beam boost for a further 1000 cGy in 5 sessions utilizing 18 MEV electrons. An isodose plan and dosimetry are requested.

## 2011-07-26 NOTE — Progress Notes (Signed)
[facility] Brief Psychosocial Assessment Clinical Social Work  Clinical Social Work was referred by Kaylyn Lim for assessment of psychosocial needs.  Clinical Social Worker met with patient at [facility] to offer support and assess for needs.  Pt was diagnosed with breast cancer in December 2012 and had surgery this past January.  CSW and pt discussed pt's diagnosis and treatment plan, and processed her feelings associated with her recent diagnosis.  Pt expressed feeling overwhelmed and increased anxiety.  CSW validated the pt's feelings and discussed appropriate coping techniques to use when the pt feels anxious.  Pt stated she had attended a support group with her husband, but was not comfortable sharing her feelings with a large number of people.  CSW informed pt of the individual support and counseling services available at [facility]  Pt expressed interest in the Reach to Recovery program and feels that she will benefit from talking one on one.  CSW will complete referral to reach to Recovery.  Pt continues to have concerns adjusting to her diagnosis, but recognizes the need for support.  Pt describes her husband as supportive, but believes he would benefit from support resources and caregiver information.  CSW and pt discussed the importance of emotional support for her and her husband during treatment.  Pt recognizes the importance of her emotional health and is open to support resources.  Pt will benefit from continued support and counseling as she adjust to her diagnosis.  CSW encouraged pt to call with and needs or concerns.  CSW will follow up with pt and provide continued emotional support.             Patient identified barriers to care as : family concerns emotional concerns  Clinical Social Work interventions: Reach to Recovery referral  Emotional Support/counseling Support services information Processing feelings/concerns associated with treatment and adjusting to diagnosis   Casey Wall, MSW,  LCSW Clinical Social Worker [facility] [phone]

## 2011-07-26 NOTE — Progress Notes (Signed)
Met with patient to discuss RO billing. Pt expressed fin concerns and was given an EPP.   Rad Tx: [id number] Extrl Beam  Attending Rad: Dr. Dayton Scrape   Dx: 174.4 Upper-outer quadrant of breast

## 2011-07-27 ENCOUNTER — Encounter: Payer: Self-pay | Admitting: *Deleted

## 2011-07-27 ENCOUNTER — Encounter: Payer: Self-pay | Admitting: Oncology

## 2011-07-28 ENCOUNTER — Telehealth (INDEPENDENT_AMBULATORY_CARE_PROVIDER_SITE_OTHER): Payer: Self-pay | Admitting: General Surgery

## 2011-07-28 NOTE — Telephone Encounter (Signed)
Wants to switch from Dr Dayton Scrape as her radiation oncologist.

## 2011-07-29 NOTE — Telephone Encounter (Signed)
Spoke with Dawn who advised speaking with radiation oncology. They will call the patient to make the switch.

## 2011-07-29 NOTE — Telephone Encounter (Signed)
Per Dr Jamey Ripa switch patient to Dr Michell Heinrich. Called Dawn @ [facility] and left message for her to call me back.

## 2011-08-01 ENCOUNTER — Other Ambulatory Visit: Payer: Self-pay | Admitting: *Deleted

## 2011-08-01 ENCOUNTER — Telehealth (INDEPENDENT_AMBULATORY_CARE_PROVIDER_SITE_OTHER): Payer: Self-pay | Admitting: Surgery

## 2011-08-01 DIAGNOSIS — C50419 Malignant neoplasm of upper-outer quadrant of unspecified female breast: Secondary | ICD-10-CM

## 2011-08-01 NOTE — Telephone Encounter (Signed)
After patient wanted to switch to see Dr Michell Heinrich she spoke with Dr Dayton Scrape and has some concerns about that conversation. He mentioned to her that patient who have a positive lymph node almost always have chemo and now she is concerned that she needs it and would just like to speak with you about everything. Please give patient a call [phone].

## 2011-08-01 NOTE — Telephone Encounter (Signed)
Patient called and left a message on my voicemail, I did forward it to you, but I did call her to make sure it wasn't urgent.  She needs to Dr. Jillyn Hidden regarding her case, she's heard some conflicting remarks on her treatment and would like to discuss it with Dr. Jillyn Hidden, she is really confused an what's going and just needs some clarity and peace of mind, please call.

## 2011-08-02 ENCOUNTER — Encounter: Payer: Self-pay | Admitting: Radiation Oncology

## 2011-08-02 ENCOUNTER — Ambulatory Visit
Admission: RE | Admit: 2011-08-02 | Discharge: 2011-08-02 | Disposition: A | Discharge status: Home / Self Care | Payer: BC Managed Care – PPO | Source: Ambulatory Visit | Attending: Radiation Oncology | Admitting: Radiation Oncology

## 2011-08-02 NOTE — Progress Notes (Signed)
Simulation verification:  The patient underwent similar to verification for treatment to her right breast. Her isocenter is in good position and the multileaf collimators contoured the treatment volume appropriately.

## 2011-08-03 ENCOUNTER — Encounter: Payer: Self-pay | Admitting: Radiation Oncology

## 2011-08-03 ENCOUNTER — Ambulatory Visit: Payer: BC Managed Care – PPO

## 2011-08-03 ENCOUNTER — Telehealth: Payer: Self-pay | Admitting: Radiation Oncology

## 2011-08-03 ENCOUNTER — Other Ambulatory Visit: Payer: BC Managed Care – PPO | Admitting: Lab

## 2011-08-03 NOTE — Telephone Encounter (Signed)
Casey Wall, of research, phoned for clarification about which physician in radiation oncology is caring for this patient. Confirmed after conversing with Dr. Dayton Scrape that Dr. Michell Heinrich will begin caring for this patient on Monday, August 08, 2011. Also, informed Casey Wall this patient would not begin radiation today but rather Monday. Patient cancelled for today and scheduled to return on Friday, March 8th for simulation. Informed [name] that [name], RT RT has already contact this patient reference the change in her appointments.

## 2011-08-04 ENCOUNTER — Ambulatory Visit: Payer: BC Managed Care – PPO

## 2011-08-05 ENCOUNTER — Ambulatory Visit
Admission: RE | Admit: 2011-08-05 | Discharge: 2011-08-05 | Disposition: A | Discharge status: Home / Self Care | Payer: BC Managed Care – PPO | Source: Ambulatory Visit | Attending: Radiation Oncology | Admitting: Radiation Oncology

## 2011-08-08 ENCOUNTER — Other Ambulatory Visit: Payer: BC Managed Care – PPO | Admitting: Lab

## 2011-08-08 ENCOUNTER — Ambulatory Visit
Admission: RE | Admit: 2011-08-08 | Payer: BC Managed Care – PPO | Source: Ambulatory Visit | Attending: Radiation Oncology | Admitting: Radiation Oncology

## 2011-08-08 ENCOUNTER — Ambulatory Visit
Admission: RE | Admit: 2011-08-08 | Discharge: 2011-08-08 | Disposition: A | Discharge status: Home / Self Care | Payer: BC Managed Care – PPO | Source: Ambulatory Visit | Attending: Radiation Oncology | Admitting: Radiation Oncology

## 2011-08-09 ENCOUNTER — Ambulatory Visit
Admission: RE | Admit: 2011-08-09 | Discharge: 2011-08-09 | Disposition: A | Discharge status: Home / Self Care | Payer: BC Managed Care – PPO | Source: Ambulatory Visit | Attending: Radiation Oncology | Admitting: Radiation Oncology

## 2011-08-09 ENCOUNTER — Encounter: Payer: Self-pay | Admitting: Radiation Oncology

## 2011-08-09 DIAGNOSIS — C50419 Malignant neoplasm of upper-outer quadrant of unspecified female breast: Secondary | ICD-10-CM

## 2011-08-09 NOTE — Progress Notes (Signed)
Patient presents to the clinic today for under treat visit with Dr. Michell Heinrich. Patient is alert and oriented to person, place, and time. No distress noted. Steady gait noted. Pleasant affect noted. Patient denies pain at this time. Patient has no complaints at this time. Reported all findings to Dr. Michell Heinrich.

## 2011-08-09 NOTE — Progress Notes (Signed)
Weekly Management Note Current Dose:  3.6 Gy  Projected Dose: 60.4 Gy   Narrative:  The patient presents for routine under treatment assessment.  CBCT/MVCT images/Port film x-rays were reviewed.  The chart was checked. Doing well.   Physical Findings: Weight: 162 lb 3.2 oz (73.573 kg). Unchanged skin.  Impression:  The patient is tolerating radiation.  Plan:  Continue treatment as planned. Post sim education performed.

## 2011-08-10 ENCOUNTER — Ambulatory Visit
Admission: RE | Admit: 2011-08-10 | Discharge: 2011-08-10 | Disposition: A | Discharge status: Home / Self Care | Payer: BC Managed Care – PPO | Source: Ambulatory Visit | Attending: Radiation Oncology | Admitting: Radiation Oncology

## 2011-08-11 ENCOUNTER — Ambulatory Visit (HOSPITAL_BASED_OUTPATIENT_CLINIC_OR_DEPARTMENT_OTHER): Payer: BC Managed Care – PPO | Admitting: Oncology

## 2011-08-11 ENCOUNTER — Ambulatory Visit
Admission: RE | Admit: 2011-08-11 | Discharge: 2011-08-11 | Disposition: A | Discharge status: Home / Self Care | Payer: BC Managed Care – PPO | Source: Ambulatory Visit | Attending: Radiation Oncology | Admitting: Radiation Oncology

## 2011-08-11 ENCOUNTER — Telehealth: Payer: Self-pay | Admitting: *Deleted

## 2011-08-11 VITALS — BP 119/74 | HR 73 | Temp 97.8°F | Ht 67.0 in | Wt 162.4 lb

## 2011-08-11 DIAGNOSIS — C50419 Malignant neoplasm of upper-outer quadrant of unspecified female breast: Secondary | ICD-10-CM

## 2011-08-11 DIAGNOSIS — E559 Vitamin D deficiency, unspecified: Secondary | ICD-10-CM

## 2011-08-11 DIAGNOSIS — F411 Generalized anxiety disorder: Secondary | ICD-10-CM

## 2011-08-11 MED ORDER — ZOLPIDEM TARTRATE 5 MG PO TABS: 5.0000 mg | ORAL_TABLET | Freq: Every evening | ORAL | Status: DC | PRN

## 2011-08-11 NOTE — Telephone Encounter (Signed)
called left voice message on dr.peters voicemail about calling the patient for an appointment made the patient appointment for 09-2011

## 2011-08-11 NOTE — Progress Notes (Signed)
Patient returns for followup. She has been having more problems with hot flashes which keep her up at night. She has tried melatonin with limited success to help her sleep. She is anxious. Her mother is been hospitalized in [place]. She has not tried the Effexor as it does make her more agitated. I spent about 30 minutes today discussing the situation with her. I recommended that she try the Effexor and continue this as a mechanical a week or 2 for it to "kick in"  I also recommended Peridin C AS WELL as vitamin E to help with hot flashes. I also recommended that she see a psychologist for counseling. Plan to see her in followup in about 2 months after she completes radiation.  Pierce Crane M.D. FRCPC

## 2011-08-11 NOTE — Progress Notes (Signed)
Referral MD Dr Ivor Reining; dr Burney Gauze  [phone]    Reason for Referral: Breast mass  No chief complaint on file. : Ms Mcmenamin had a  F/u screening mammogram in [place], as part of a short term f/u mammogram . This revealed a 1.4x 1.3 x 1.2 cm mass at the 10 o'clock position. A biopsy on 05/04/11 revealed an invasive ductal cancer , Grade 1. Er 100%, PR 100%, Ki67 8%, her2 -ve. An MRI on 05/09/11 showed a mass of 1 x 1.6x 1.4 cm. No associated enlarged lymph nodes were seen. Lumpectomy with sentinel node biopsy was performed on 06/01/11. Final pathology revealed 1.7 cm G1, tumor with clear surgical conditions. 1 of 2 sentinel lymph nodes was involved with a 0.8 cm focus of tumor.Oncotype score 14, risk of recurrence 9%. Started on xrt this week.     HPI:   Past Medical History  Diagnosis Date  . GERD (gastroesophageal reflux disease)   . Constipation   . Breast cancer 04/2011    right  . Seasonal allergies   . Stomach ulcer   . History of fibrocystic disease of breast   . Anemia   . Back pain   :  Past Surgical History  Procedure Date  . Total abdominal hysterectomy with BSO  1997  . Cesarean section 1980  . Cervical discectomy 2007  . Breast lumpectomy 06/01/11    right  . Breast cyst aspirated   :  Current outpatient prescriptions:cetirizine (ZYRTEC) 10 MG tablet, Take 10 mg by mouth as needed. , Disp: , Rfl: ;  Cholecalciferol (VITAMIN D PO), Take 1,000 mg by mouth daily. , Disp: , Rfl: ;  fluticasone (FLONASE) 50 MCG/ACT nasal spray, Place 2 sprays into the nose as needed. , Disp: , Rfl: ;  Melatonin 1 MG CAPS, Take 1 capsule by mouth at bedtime as needed. Pt. Not sure of dose, Disp: , Rfl:  Polyethylene Glycol 3350 (MIRALAX PO), Take by mouth as needed.  , Disp: , Rfl: ;  Probiotic Product (SOLUBLE FIBER/PROBIOTICS PO), Take 1 capsule by mouth 3 (three) times daily., Disp: , Rfl: ;  RABEprazole (ACIPHEX) 20 MG tablet, Take 20 mg by mouth daily. AM , Disp: , Rfl: ;   sucralfate (CARAFATE) 1 G tablet, as needed. , Disp: , Rfl: ;  tretinoin microspheres (RETIN-A MICRO) 0.04 % gel, Apply topically at bedtime. , Disp: , Rfl:  estradiol (VIVELLE-DOT) 0.05 MG/24HR, Place 1 patch onto the skin once a week.  , Disp: , Rfl: ;  HYDROmorphone (DILAUDID) 4 MG tablet, , Disp: , Rfl: :    :  No Known Allergies:  Family History  Problem Relation Age of Onset  . Lung cancer Father   . Cancer Father     lung  . Diabetes Mother   :  History   Social History  . Marital Status: Married x 2 y , 1st marriage x 9 y    Spouse Name: Renae Fickle    Number of Children: 1   . Years of Education: N/A   Occupational History  . Works as Oncologist   Social History Main Topics  . Smoking status: Never Smoker   . Smokeless tobacco: Never Used  . Alcohol Use: Yes     occasionally  . Drug Use: No  . Sexually Active: Not on file   Reproductive  hx Concern  Menarche at 8, menopause at time of hysterectomy. On HRT since tah-bso.    Social  History Narrative  . No narrative on file  :  A comprehensive review of systems was negative.  Exam:  General appearance: alert, cooperative and appears stated age Eyes: conjunctivae/corneas clear. PERRL, EOM's intact. Fundi benign. Throat: lips, mucosa, and tongue normal; teeth and gums normal Resp: clear to auscultation bilaterally and normal percussion bilaterally Breasts: normal appearance, S/p lumpectomy , well healed surgical scar, 3 cm mobile mass lt breast, no nipple retraction or axillary adenopathy. Cardio: regular rate and rhythm, S1, S2 normal, no murmur, click, rub or gallop and normal apical impulse GI: soft, non-tender; bowel sounds normal; no masses,  no organomegaly Extremities: extremities normal, atraumatic, no cyanosis or edema Lymph nodes: Cervical, supraclavicular, and axillary nodes normal. Neurologic: Grossly normal   Basename 06/13/11 1639  WBC 4.7  HGB 12.1  HCT 35.3    PLT 385     Blood smear review:  n/a Pathology:as above  Dg Chest 2 View  05/27/2011  *RADIOLOGY REPORT*  Clinical Data: Preoperative respiratory exam. Diagnosis of breast cancer.  CHEST - 2 VIEW  Comparison: Radiograph dated 03/29/2011  Findings: Heart size and vascularity are normal and the lungs are clear.  No osseous abnormality.  IMPRESSION: Normal chest.  Original Report Authenticated By: Gwynn Burly, M.D.   Nm Sentinel Node Inj-no Rpt (breast)  06/01/2011  CLINICAL DATA: breast cancer   Sulfur colloid was injected intradermally by the nuclear medicine  technologist for breast cancer sentinel node localization.     [place] Wire Localization Right  06/01/2011  *RADIOLOGY REPORT*  Clinical Data:  Recent diagnosis of right breast cancer.  The patient presents for wire localization.  NEEDLE LOCALIZATION USING ULTRASOUND GUIDANCE AND SPECIMEN RADIOGRAPH  Patient presents for needle localization prior to lumpectomy.  I met with the patient and we discussed the procedure of needle localization including risks, benefits, and alternatives. Specifically, we discussed the risks of infection, bleeding, tissue injury, and inadequate sampling.  Informed written consent was given.  Using ultrasound guidance, sterile technique, 2% lidocaine and a 7 cm modified Kopans needle, biopsy-proven carcinoma in the 10 o'clock position of the right breast approximately 15 cm from the nipple was localized using a lateral to medial approach.  The films are marked for Dr. Jamey Ripa.  Specimen radiograph is performed at [facility], and confirms the mass, biopsy clip, and an intact wire present in the tissue sample. The specimen is marked for pathology.  IMPRESSION: Needle localization right breast.  No apparent complications.  Original Report Authenticated By: Britta Mccreedy, M.D.   Mm Breast Surgical Specimen  06/01/2011  *RADIOLOGY REPORT*  Clinical Data:  Recent diagnosis of right breast cancer.  The patient presents for  wire localization.  NEEDLE LOCALIZATION USING ULTRASOUND GUIDANCE AND SPECIMEN RADIOGRAPH  Patient presents for needle localization prior to lumpectomy.  I met with the patient and we discussed the procedure of needle localization including risks, benefits, and alternatives. Specifically, we discussed the risks of infection, bleeding, tissue injury, and inadequate sampling.  Informed written consent was given.  Using ultrasound guidance, sterile technique, 2% lidocaine and a 7 cm modified Kopans needle, biopsy-proven carcinoma in the 10 o'clock position of the right breast approximately 15 cm from the nipple was localized using a lateral to medial approach.  The films are marked for Dr. Jamey Ripa.  Specimen radiograph is performed at Day [facility], and confirms the mass, biopsy clip, and an intact wire present in the tissue sample. The specimen is marked for pathology.  IMPRESSION: Needle localization right breast.  No apparent complications.  Original Report Authenticated By: Britta Mccreedy, M.D.    Assessment and Plan:    Pierce Crane MD

## 2011-08-11 NOTE — Patient Instructions (Signed)
PERIDIN C , TAKE UP TO 2 ; 3 TIMES A D AY  VITAMIN E 400 UNITS/DAY

## 2011-08-12 ENCOUNTER — Encounter: Payer: Self-pay | Admitting: Radiation Oncology

## 2011-08-12 ENCOUNTER — Ambulatory Visit
Admission: RE | Admit: 2011-08-12 | Discharge: 2011-08-12 | Disposition: A | Discharge status: Home / Self Care | Payer: BC Managed Care – PPO | Source: Ambulatory Visit | Attending: Radiation Oncology | Admitting: Radiation Oncology

## 2011-08-15 ENCOUNTER — Ambulatory Visit
Admission: RE | Admit: 2011-08-15 | Discharge: 2011-08-15 | Disposition: A | Discharge status: Home / Self Care | Payer: BC Managed Care – PPO | Source: Ambulatory Visit | Attending: Radiation Oncology | Admitting: Radiation Oncology

## 2011-08-15 ENCOUNTER — Encounter: Payer: Self-pay | Admitting: Psychiatry

## 2011-08-16 ENCOUNTER — Ambulatory Visit
Admission: RE | Admit: 2011-08-16 | Discharge: 2011-08-16 | Disposition: A | Discharge status: Home / Self Care | Payer: BC Managed Care – PPO | Source: Ambulatory Visit | Attending: Radiation Oncology | Admitting: Radiation Oncology

## 2011-08-16 VITALS — Wt 160.6 lb

## 2011-08-16 DIAGNOSIS — C50419 Malignant neoplasm of upper-outer quadrant of unspecified female breast: Secondary | ICD-10-CM

## 2011-08-16 NOTE — Progress Notes (Signed)
HERE TODAY FOR PUT, ONLY HAS C/O SHARP TWINGES IN BREAST AND I EXPLAINED TO HER THAT IT WAS THE NERVE ENDINGS TRYING TO HEAL.  POINTS OUT SOME "WHELPS" THAT SHE SAYS HAS BEEN THERE SINCE SURGERY, OTHERWISE SKING LOOKS GOOD

## 2011-08-16 NOTE — Progress Notes (Signed)
Weekly Management Note Current Dose: 12.6  Gy  Projected Dose: 60.4 Gy   Narrative:  The patient presents for routine under treatment assessment.  CBCT/MVCT images/Port film x-rays were reviewed.  The chart was checked. Doing well.  C/o prickly type sensation in breast during treatment.   Physical Findings: Weight: 160 lb 9.6 oz (72.848 kg). Unchanged  Impression:  The patient is tolerating radiation.  Plan:  Continue treatment as planned. Continue radiaplex.

## 2011-08-17 ENCOUNTER — Ambulatory Visit
Admission: RE | Admit: 2011-08-17 | Discharge: 2011-08-17 | Disposition: A | Discharge status: Home / Self Care | Payer: BC Managed Care – PPO | Source: Ambulatory Visit | Attending: Radiation Oncology | Admitting: Radiation Oncology

## 2011-08-18 ENCOUNTER — Ambulatory Visit
Admission: RE | Admit: 2011-08-18 | Discharge: 2011-08-18 | Disposition: A | Discharge status: Home / Self Care | Payer: BC Managed Care – PPO | Source: Ambulatory Visit | Attending: Radiation Oncology | Admitting: Radiation Oncology

## 2011-08-19 ENCOUNTER — Ambulatory Visit
Admission: RE | Admit: 2011-08-19 | Discharge: 2011-08-19 | Disposition: A | Discharge status: Home / Self Care | Payer: BC Managed Care – PPO | Source: Ambulatory Visit | Attending: Radiation Oncology | Admitting: Radiation Oncology

## 2011-08-22 ENCOUNTER — Ambulatory Visit
Admission: RE | Admit: 2011-08-22 | Discharge: 2011-08-22 | Disposition: A | Discharge status: Home / Self Care | Payer: BC Managed Care – PPO | Source: Ambulatory Visit | Attending: Radiation Oncology | Admitting: Radiation Oncology

## 2011-08-22 ENCOUNTER — Ambulatory Visit (INDEPENDENT_AMBULATORY_CARE_PROVIDER_SITE_OTHER): Payer: BC Managed Care – PPO | Admitting: Psychiatry

## 2011-08-22 DIAGNOSIS — F0630 Mood disorder due to known physiological condition, unspecified: Secondary | ICD-10-CM

## 2011-08-23 ENCOUNTER — Encounter: Payer: Self-pay | Admitting: Psychiatry

## 2011-08-23 ENCOUNTER — Ambulatory Visit
Admission: RE | Admit: 2011-08-23 | Discharge: 2011-08-23 | Disposition: A | Discharge status: Home / Self Care | Payer: BC Managed Care – PPO | Source: Ambulatory Visit | Attending: Radiation Oncology | Admitting: Radiation Oncology

## 2011-08-23 ENCOUNTER — Encounter: Payer: Self-pay | Admitting: Radiation Oncology

## 2011-08-23 VITALS — BP 116/70 | HR 76 | Resp 18 | Wt 161.0 lb

## 2011-08-23 DIAGNOSIS — C50419 Malignant neoplasm of upper-outer quadrant of unspecified female breast: Secondary | ICD-10-CM

## 2011-08-23 NOTE — Progress Notes (Signed)
Weekly Management Note Current Dose: 21.6  Gy  Projected Dose: 60.4G y   Narrative:  The patient presents for routine under treatment assessment.  CBCT/MVCT images/Port film x-rays were reviewed.  The chart was checked. Doing well. Working full time.  Feeling some breast soreness/heaviness. Using radiaplex.  Physical Findings: Weight: 161 lb (73.029 kg). Unchanged. Minimal skin darkening in axilla.  Impression:  The patient is tolerating radiation.  Plan:  Continue treatment as planned. Continue radiaplex. OK to add po anti-inflammatory.

## 2011-08-23 NOTE — Progress Notes (Signed)
08-22-2011  Patient seen for initial psychological evaluation.  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 She was given several coping skills handouts - Priority Tri[place], Scales of Balance, 110 Questions for  Healthy Lifestyle and 8 Coping Strategies. They were discussed with the patient and she was encouraged to bring them back with any questions she might have at the follow-up session. Next appointment 09-08-2011.

## 2011-08-23 NOTE — Progress Notes (Signed)
Received patient in the clinic today following treatment for PUT with Dr. Michell Heinrich. Patient is alert and oriented to person, place, and time. No distress noted. Steady gait noted. Pleasant affect noted. Patient denies pain at this time Reported all findings to Dr. Michell Heinrich.

## 2011-08-24 ENCOUNTER — Ambulatory Visit
Admission: RE | Admit: 2011-08-24 | Discharge: 2011-08-24 | Disposition: A | Discharge status: Home / Self Care | Payer: BC Managed Care – PPO | Source: Ambulatory Visit | Attending: Radiation Oncology | Admitting: Radiation Oncology

## 2011-08-25 ENCOUNTER — Ambulatory Visit
Admission: RE | Admit: 2011-08-25 | Discharge: 2011-08-25 | Disposition: A | Discharge status: Home / Self Care | Payer: BC Managed Care – PPO | Source: Ambulatory Visit | Attending: Radiation Oncology | Admitting: Radiation Oncology

## 2011-08-26 ENCOUNTER — Ambulatory Visit
Admission: RE | Admit: 2011-08-26 | Discharge: 2011-08-26 | Disposition: A | Discharge status: Home / Self Care | Payer: BC Managed Care – PPO | Source: Ambulatory Visit | Attending: Radiation Oncology | Admitting: Radiation Oncology

## 2011-08-29 ENCOUNTER — Ambulatory Visit
Admission: RE | Admit: 2011-08-29 | Discharge: 2011-08-29 | Disposition: A | Discharge status: Home / Self Care | Payer: BC Managed Care – PPO | Source: Ambulatory Visit | Attending: Radiation Oncology | Admitting: Radiation Oncology

## 2011-08-29 ENCOUNTER — Encounter: Payer: Self-pay | Admitting: *Deleted

## 2011-08-29 NOTE — Progress Notes (Signed)
08/29/11- [facility] [address] study notes - WEEK 3 - The pt was into the office today for her continued radiation therapy.  The pt's week 3 photos were taken and uploaded today.  The research nurse also assessed the pt's affected breast.  The pt states that her affected breast is "sore to touch".  According to the ONS criteria for Radiation-Induced Acute Skin Reaction, the pt is a grade 2.  The pt is using radiaplex on her affected area.

## 2011-08-30 ENCOUNTER — Ambulatory Visit
Admission: RE | Admit: 2011-08-30 | Discharge: 2011-08-30 | Disposition: A | Discharge status: Home / Self Care | Payer: BC Managed Care – PPO | Source: Ambulatory Visit | Attending: Radiation Oncology | Admitting: Radiation Oncology

## 2011-08-30 ENCOUNTER — Other Ambulatory Visit: Payer: Self-pay | Admitting: *Deleted

## 2011-08-30 DIAGNOSIS — C50419 Malignant neoplasm of upper-outer quadrant of unspecified female breast: Secondary | ICD-10-CM

## 2011-08-30 NOTE — Progress Notes (Signed)
Weekly Management Note Current Dose: 30.6  Gy  Projected Dose: 60.4 Gy   Narrative:  The patient presents for routine under treatment assessment.  CBCT/MVCT images/Port film x-rays were reviewed.  The chart was checked. Doing well. Husband accompanies her. Nausea in the morning.   Physical Findings: Slightly dark right breast.   Impression:  The patient is tolerating radiation.  Plan:  Continue treatment as planned. Discussed survivorship issues. Continue radiaplex.

## 2011-08-30 NOTE — Progress Notes (Signed)
HERE FOR PUT.  SKIN LOOKS GOOD, JUST A LITTLE DARKER WITH NO DESQUAMATION.  SAYS SHE STILL HAS SOME TINGLING IN HER BREAST BUT NO TRUE PAIN.  ALSO HAS NAUSEA AND WANTS TO KNOW IF YOU WILL GIVE HER SOMETHING FOR IT

## 2011-08-31 ENCOUNTER — Ambulatory Visit
Admission: RE | Admit: 2011-08-31 | Discharge: 2011-08-31 | Disposition: A | Discharge status: Home / Self Care | Payer: BC Managed Care – PPO | Source: Ambulatory Visit | Attending: Radiation Oncology | Admitting: Radiation Oncology

## 2011-09-01 ENCOUNTER — Ambulatory Visit
Admission: RE | Admit: 2011-09-01 | Discharge: 2011-09-01 | Disposition: A | Discharge status: Home / Self Care | Payer: BC Managed Care – PPO | Source: Ambulatory Visit | Attending: Radiation Oncology | Admitting: Radiation Oncology

## 2011-09-02 ENCOUNTER — Ambulatory Visit
Admission: RE | Admit: 2011-09-02 | Discharge: 2011-09-02 | Disposition: A | Discharge status: Home / Self Care | Payer: BC Managed Care – PPO | Source: Ambulatory Visit | Attending: Radiation Oncology | Admitting: Radiation Oncology

## 2011-09-02 ENCOUNTER — Encounter: Payer: Self-pay | Admitting: Radiation Oncology

## 2011-09-02 VITALS — BP 119/77 | HR 87 | Temp 98.6°F | Resp 18 | Wt 160.7 lb

## 2011-09-02 DIAGNOSIS — C50419 Malignant neoplasm of upper-outer quadrant of unspecified female breast: Secondary | ICD-10-CM

## 2011-09-02 NOTE — Progress Notes (Signed)
Patient presents to the clinic today following treatment requesting to be seen. Patient reports that she has felt dizzy since this morning. Patient is alert and oriented to person, place, and time. No distress noted. Steady gait noted. Pleasant affect noted. Patient denies pain at this time. Patient reports her right/treated breast is just "a little darker" no desquamation noted. Patient reports frequent hot flashes with perfuse sweating "sometimes ten times per day." Patient reports eating salad, pasta salad and chicken all today. Patient denies nausea and vomiting. Patient reports diarrhea yesterday and the day before but reports it resolved today after she stopped taking probiotics. Sitting and standing vitals stable. Reported all findings to Dr. Basilio Cairo.

## 2011-09-02 NOTE — Progress Notes (Signed)
   Weekly Management Note Current Dose:   3600 cGy  Projected Dose:  6040 cGy   Narrative:  The patient presents for routine under treatment assessment.  CBCT/MVCT images/Port film x-rays were reviewed.  The chart was checked. She reports that she has been dizzy since this morning. She did start taking Ambien 2 weeks ago. She took 2.5 mg last night. This is the first time she's been dizzy since taking it, however. She denies headaches or nausea. She feels exhausted. She says that this feels similar to the way that she felt when she had anemia in the past. The anemia was addressed with a hysterectomy. She denies any blood in her stool urine. She denies any other signs of bleeding. Her hemoglobin was 12 in January.  Her sleep is suboptimal, but stable with her baseline.  Physical Findings: Weight: 72.893 kg (160 lb 11.2 oz).   Filed Vitals:   09/02/11 1712 09/02/11 1715  BP: 109/71 119/77  Pulse: 91 87  Temp: 98.6 F (37 C) 98.6 F (37 C)  TempSrc: Oral   Resp: 18   Weight: 72.893 kg (160 lb 11.2 oz)    She is tired-appearing Tympanic membranes are clear bilaterally. Extraocular movements are intact. No nystagmus. Rapidly alternating movements are intact. Finger-to-nose testing is slightly inaccurate. She walks straight, but slowly. No slurred speech.  Impression:  The patient is tolerating radiotherapy. New onset dizziness and exhaustion.  Plan:  Continue radiotherapy as planned. The etiology of her symptoms is unclear. I would not recommend an MRI or any lab work quite yet. I told her that if she feels worse over the weekend she should call our office office and one of the doctors on call will be in touch with her. Otherwise, I asked her to meet with me on Monday if she is not feeling better. I told her Dr. Michell Heinrich will be back on Tuesday. I did tell her to hold the Ambien for now. I also asked her to hold alcohol consumption for now. She is eating and drinking well and I told her to  continue that.  Of note, we offered to help her with transportation home, but she felt confident that she could drive home without any difficulty. Since her neurologic exam did not show any significant deficits, I did not press the issue.

## 2011-09-05 ENCOUNTER — Ambulatory Visit
Admission: RE | Admit: 2011-09-05 | Discharge: 2011-09-05 | Disposition: A | Discharge status: Home / Self Care | Payer: BC Managed Care – PPO | Source: Ambulatory Visit | Attending: Radiation Oncology | Admitting: Radiation Oncology

## 2011-09-06 ENCOUNTER — Ambulatory Visit
Admission: RE | Admit: 2011-09-06 | Discharge: 2011-09-06 | Disposition: A | Discharge status: Home / Self Care | Payer: BC Managed Care – PPO | Source: Ambulatory Visit | Attending: Radiation Oncology | Admitting: Radiation Oncology

## 2011-09-06 VITALS — Wt 160.4 lb

## 2011-09-06 DIAGNOSIS — C50419 Malignant neoplasm of upper-outer quadrant of unspecified female breast: Secondary | ICD-10-CM

## 2011-09-06 MED ORDER — RADIAPLEXRX EX GEL
Freq: Once | CUTANEOUS | Status: AC
  Administered 2011-09-06: 1 via TOPICAL

## 2011-09-06 NOTE — Progress Notes (Signed)
Encounter addended by: Amanda Pea, RN on: 09/06/2011  5:15 PM<BR>     Documentation filed: Inpatient MAR, Orders

## 2011-09-06 NOTE — Progress Notes (Signed)
Weekly Management Note Current Dose: 39.6  Gy  Projected Dose: 60.4 Gy   Narrative:  The patient presents for routine under treatment assessment.  CBCT/MVCT images/Port film x-rays were reviewed.  The chart was checked. Doing well. Took allergy meds and no further dizzy spells. Decreased energy levels. Skin is irritated in axilla.  Physical Findings: Weight: 160 lb 6.4 oz (72.757 kg). Dark skin over right breast and axilla.   Impression:  The patient is tolerating radiation.  Plan:  Continue treatment as planned. Continue radiaplex. Add hydrogel if needed.

## 2011-09-06 NOTE — Progress Notes (Signed)
Name: Casey Wall   MRN: [id number]  Date:  09/06/2011   DOB: March 30, 1956  Status:outpatient    DIAGNOSIS: Breast cancer.  CONSENT VERIFIED: yes   SET UP: Patient is setup supine   IMMOBILIZATION:  The following immobilization was used:Custom Moldable Pillow, breast board.   NARRATIVE: [name] underwent complex simulation and treatment planning for her boost treatment today.  Her tumor volume was outlined on the planning CT scan.    18  MeV electrons will be prescribed to the 95%  Isodose line.   A block will be used for beam modification purposes.  A special port plan is requested.

## 2011-09-06 NOTE — Progress Notes (Signed)
HERE FOR PUT TODAY.  C/O BURNING UNDER ARM FROM TX, SKIN DOES LOOK MUCH DARKER IN THIS AREA, ASKS FOR ANOTHER TUBE OF RADIAPLEX WHICH I WILL GIVE.  HAVING HOT FLASHES AND NOT SLEEPING WELL

## 2011-09-07 ENCOUNTER — Ambulatory Visit
Admission: RE | Admit: 2011-09-07 | Discharge: 2011-09-07 | Disposition: A | Discharge status: Home / Self Care | Payer: BC Managed Care – PPO | Source: Ambulatory Visit | Attending: Radiation Oncology | Admitting: Radiation Oncology

## 2011-09-08 ENCOUNTER — Ambulatory Visit (INDEPENDENT_AMBULATORY_CARE_PROVIDER_SITE_OTHER): Payer: BC Managed Care – PPO | Admitting: Psychiatry

## 2011-09-08 ENCOUNTER — Ambulatory Visit
Admission: RE | Admit: 2011-09-08 | Discharge: 2011-09-08 | Disposition: A | Discharge status: Home / Self Care | Payer: BC Managed Care – PPO | Source: Ambulatory Visit | Attending: Radiation Oncology | Admitting: Radiation Oncology

## 2011-09-08 ENCOUNTER — Encounter: Payer: Self-pay | Admitting: Psychiatry

## 2011-09-08 DIAGNOSIS — F0630 Mood disorder due to known physiological condition, unspecified: Secondary | ICD-10-CM

## 2011-09-08 DIAGNOSIS — Z7189 Other specified counseling: Secondary | ICD-10-CM

## 2011-09-08 NOTE — Progress Notes (Signed)
40-02-2012  Patient seen for individual psychotherapy.  Session focused on patient's marital issues, gender differences and marital enhancement techniques.  Next appointment 09-22-2011

## 2011-09-09 ENCOUNTER — Ambulatory Visit
Admission: RE | Admit: 2011-09-09 | Discharge: 2011-09-09 | Disposition: A | Discharge status: Home / Self Care | Payer: BC Managed Care – PPO | Source: Ambulatory Visit | Attending: Radiation Oncology | Admitting: Radiation Oncology

## 2011-09-12 ENCOUNTER — Ambulatory Visit
Admission: RE | Admit: 2011-09-12 | Discharge: 2011-09-12 | Disposition: A | Discharge status: Home / Self Care | Payer: BC Managed Care – PPO | Source: Ambulatory Visit | Attending: Radiation Oncology | Admitting: Radiation Oncology

## 2011-09-12 DIAGNOSIS — C50419 Malignant neoplasm of upper-outer quadrant of unspecified female breast: Secondary | ICD-10-CM | POA: Insufficient documentation

## 2011-09-12 DIAGNOSIS — Z79899 Other long term (current) drug therapy: Secondary | ICD-10-CM | POA: Insufficient documentation

## 2011-09-12 DIAGNOSIS — R5381 Other malaise: Secondary | ICD-10-CM | POA: Insufficient documentation

## 2011-09-12 DIAGNOSIS — Z510 Encounter for antineoplastic radiation therapy: Secondary | ICD-10-CM | POA: Insufficient documentation

## 2011-09-12 DIAGNOSIS — Z809 Family history of malignant neoplasm, unspecified: Secondary | ICD-10-CM | POA: Insufficient documentation

## 2011-09-12 DIAGNOSIS — R42 Dizziness and giddiness: Secondary | ICD-10-CM | POA: Insufficient documentation

## 2011-09-12 DIAGNOSIS — K219 Gastro-esophageal reflux disease without esophagitis: Secondary | ICD-10-CM | POA: Insufficient documentation

## 2011-09-12 DIAGNOSIS — R5383 Other fatigue: Secondary | ICD-10-CM | POA: Insufficient documentation

## 2011-09-12 DIAGNOSIS — R110 Nausea: Secondary | ICD-10-CM | POA: Insufficient documentation

## 2011-09-13 ENCOUNTER — Ambulatory Visit
Admission: RE | Admit: 2011-09-13 | Discharge: 2011-09-13 | Disposition: A | Discharge status: Home / Self Care | Payer: BC Managed Care – PPO | Source: Ambulatory Visit | Attending: Radiation Oncology | Admitting: Radiation Oncology

## 2011-09-13 ENCOUNTER — Encounter: Payer: Self-pay | Admitting: Radiation Oncology

## 2011-09-13 VITALS — Wt 161.8 lb

## 2011-09-13 DIAGNOSIS — C50419 Malignant neoplasm of upper-outer quadrant of unspecified female breast: Secondary | ICD-10-CM

## 2011-09-13 NOTE — Progress Notes (Signed)
HERE TODAY FOR PUT OF RIGHT BREAST.  CONTINUES TO HAVE HOT FLASHES AT NIGHT BUT NO OTHER C/O

## 2011-09-14 ENCOUNTER — Encounter (INDEPENDENT_AMBULATORY_CARE_PROVIDER_SITE_OTHER): Payer: BC Managed Care – PPO | Admitting: Surgery

## 2011-09-14 ENCOUNTER — Ambulatory Visit
Admission: RE | Admit: 2011-09-14 | Discharge: 2011-09-14 | Disposition: A | Discharge status: Home / Self Care | Payer: BC Managed Care – PPO | Source: Ambulatory Visit | Attending: Radiation Oncology | Admitting: Radiation Oncology

## 2011-09-14 NOTE — Progress Notes (Signed)
DIAGNOSIS:  Right breast cancer.  NARRATIVE:  Casey Wall is seen today for weekly assessment.  She has completed 4860 cGy of a planned 6040 cGy directed to the right breast area.  The patient is tolerating her treatments well at this time with minimal discomfort in the breast.  She denies any significant fatigue.  EXAMINATION:  There are hyperpigmentation changes throughout the breast area.  The lungs are clear.  The heart has a regular rhythm and rate. There is no skin breakdown appreciated.  IMPRESSION/PLAN:  The patient is tolerating her radiation treatments well at this time.  The patient's radiation fields are setting up accurately.  Earlier today I did check the patient's electron setup, which will begin on April 18th.  The patient's radiation chart was checked today.  Plan is to continue with breast conservation therapy to a cumulative dose of 6040 cGy.    ______________________________ Billie Lade, Ph.D., M.D. JDK/MEDQ  D:  09/13/2011  T:  09/14/2011  Job:  [id number]

## 2011-09-15 ENCOUNTER — Ambulatory Visit
Admission: RE | Admit: 2011-09-15 | Discharge: 2011-09-15 | Disposition: A | Discharge status: Home / Self Care | Payer: BC Managed Care – PPO | Source: Ambulatory Visit | Attending: Radiation Oncology | Admitting: Radiation Oncology

## 2011-09-16 ENCOUNTER — Ambulatory Visit
Admission: RE | Admit: 2011-09-16 | Discharge: 2011-09-16 | Disposition: A | Discharge status: Home / Self Care | Payer: BC Managed Care – PPO | Source: Ambulatory Visit | Attending: Radiation Oncology | Admitting: Radiation Oncology

## 2011-09-19 ENCOUNTER — Ambulatory Visit
Admission: RE | Admit: 2011-09-19 | Discharge: 2011-09-19 | Disposition: A | Discharge status: Home / Self Care | Payer: BC Managed Care – PPO | Source: Ambulatory Visit | Attending: Radiation Oncology | Admitting: Radiation Oncology

## 2011-09-20 ENCOUNTER — Ambulatory Visit
Admission: RE | Admit: 2011-09-20 | Discharge: 2011-09-20 | Disposition: A | Discharge status: Home / Self Care | Payer: BC Managed Care – PPO | Source: Ambulatory Visit | Attending: Radiation Oncology | Admitting: Radiation Oncology

## 2011-09-20 DIAGNOSIS — C50419 Malignant neoplasm of upper-outer quadrant of unspecified female breast: Secondary | ICD-10-CM

## 2011-09-20 NOTE — Progress Notes (Signed)
HERE FOR PUT OF RIGHT BREAST.  NO C/O TODAY EXCEPT FOR THE INTERMITTENT SHOOTING PAIN.  SKIN LOOKS GOOD , JUST A LITTLE DARKER

## 2011-09-20 NOTE — Progress Notes (Signed)
Weekly Management Note Current Dose:  58.4 Gy  Projected Dose: 60.4 Gy   Narrative:  The patient presents for routine under treatment assessment.  CBCT/MVCT images/Port film x-rays were reviewed.  The chart was checked. Doing well. Excited to be finishing.   Physical Findings: Dry desquamation over right breast. Inframammary folds clear.  Impression:  The patient is tolerating radiation.  Plan:  Continue treatment as planned. Continue radiaplex x 2 weeks. Then lotion with vit e. F/u with rubin scheduled.

## 2011-09-21 ENCOUNTER — Encounter: Payer: Self-pay | Admitting: *Deleted

## 2011-09-21 ENCOUNTER — Ambulatory Visit
Admission: RE | Admit: 2011-09-21 | Discharge: 2011-09-21 | Disposition: A | Discharge status: Home / Self Care | Payer: BC Managed Care – PPO | Source: Ambulatory Visit | Attending: Radiation Oncology | Admitting: Radiation Oncology

## 2011-09-21 ENCOUNTER — Other Ambulatory Visit: Payer: BC Managed Care – PPO | Admitting: Lab

## 2011-09-21 ENCOUNTER — Encounter: Payer: Self-pay | Admitting: Radiation Oncology

## 2011-09-21 NOTE — Progress Notes (Signed)
09/21/11 at 4:30pm [facility] [id number] - Last day of radiation study notes- The pt was into the cancer center this afternoon for her last radiation treatment.  She completed and returned her questionnaires today.  She then had her research blood and her urine obtained for study purposes. The blood and urine was shipped this afternoon.   The pt also had her last day of radiation photos taken today.  The photos will be uploaded to the study tomorrow.  The pt was seen and examined yesterday by Dr. Michell Heinrich, and it was noted that the pt has some "dry desquamation over right breast" which is a grade 3 skin toxicity on the ONS Criteria for Radiation-Induced Acute Skin Reaction.  The pt reports that she is still using Radiaplex gel on her affected breast.  The pt has not experienced a recurrence of her breast cancer.  She also is not taking any hormonal therapy, targeted therapy or chemotherapy. She is aware of her 1 month follow up appointment on 10/20/11 with Dr. Michell Heinrich.  Rn will ask the pt's dosimetrist to help complete the Breast/Chest Wall RT completion form for accuracy.  The pt was thanked for her support of this clinical trial.    09/23/11 at 3:01pm- Dion Body, dosimetrist, provided the information to complete the Breast/chest wall radiation completion form.  The research nurse will fax the pt's completed "last day of radiation" assessment forms to [facility] today.

## 2011-09-22 ENCOUNTER — Ambulatory Visit (INDEPENDENT_AMBULATORY_CARE_PROVIDER_SITE_OTHER): Payer: BC Managed Care – PPO | Admitting: Psychiatry

## 2011-09-22 DIAGNOSIS — F0630 Mood disorder due to known physiological condition, unspecified: Secondary | ICD-10-CM

## 2011-09-22 DIAGNOSIS — Z7189 Other specified counseling: Secondary | ICD-10-CM

## 2011-09-22 NOTE — Progress Notes (Signed)
09-21-2011  Patient seen for individual psychotherapy.  Session focused on marital issues and psychological tools to enhance same.  Patient very concerned about husband's moods, fatigue, hair loss, dry skin.  Strongly urged her to make an appointment for his MD to examine him.  Next appointment 10-06-2011.

## 2011-09-23 NOTE — Progress Notes (Signed)
  Radiation Oncology         [phone] ________________________________  Name: Casey Wall MRN: [id number]  Date: 09/21/2011  DOB: 1956-01-23  End of Treatment Note  Diagnosis:  Stage IIA (T1, N1, M0) invasive ductal carcinoma of the right breast   Indication for treatment:  Curative  Radiation treatment dates:  08/08/2011-09/21/2011  Site/dose:   Right breast / 50.4 Gy @ 1.8 Gray per fraction x 28 fractions   Right boost / 10 Gy @ 2 Gray per fraction x 5 fractions  Beams/energy:   6, 10 and 18 MV photons in opposed tangents with reduced fields/ 18 MeV electrons  Narrative: The patient tolerated radiation treatment relatively well.   She had the expected skin darkening but was able to continue working throughout her course of treatment.   Plan: The patient has completed radiation treatment. The patient will return to radiation oncology clinic for routine followup in one month. I advised them to call or return sooner if they have any questions or concerns related to their recovery or treatment.  ------------------------------------------------  Lurline Hare, MD

## 2011-09-29 ENCOUNTER — Telehealth: Payer: Self-pay | Admitting: Oncology

## 2011-09-29 NOTE — Telephone Encounter (Signed)
S/w the pt and she is aware of her cancelled may appts and r/s to June due to the md will be out of the office

## 2011-09-30 ENCOUNTER — Encounter: Payer: Self-pay | Admitting: *Deleted

## 2011-09-30 ENCOUNTER — Telehealth: Payer: Self-pay | Admitting: Certified Registered Nurse Anesthetist

## 2011-09-30 NOTE — Progress Notes (Signed)
Pt given permission for massage

## 2011-09-30 NOTE — Telephone Encounter (Signed)
Pt. Called for approval for massage scheduled for Monday.  Called pt. Back at [phone] and instructed to come in to pick up script for massage at [facility]. HL

## 2011-10-05 ENCOUNTER — Ambulatory Visit (INDEPENDENT_AMBULATORY_CARE_PROVIDER_SITE_OTHER): Payer: BC Managed Care – PPO | Admitting: Psychiatry

## 2011-10-05 DIAGNOSIS — F0630 Mood disorder due to known physiological condition, unspecified: Secondary | ICD-10-CM

## 2011-10-05 DIAGNOSIS — Z7189 Other specified counseling: Secondary | ICD-10-CM

## 2011-10-06 NOTE — Progress Notes (Signed)
10-05-2011  Patient seen for individual psychotherapy.  Patient having episodes of extreme anxiety.  Session focused on coping skills and issues of mortality.  Gave patient "Life After Life" at her request.  Next appointment 10-19-2011.

## 2011-10-14 ENCOUNTER — Ambulatory Visit: Payer: BC Managed Care – PPO | Admitting: Oncology

## 2011-10-14 ENCOUNTER — Other Ambulatory Visit: Payer: BC Managed Care – PPO | Admitting: Lab

## 2011-10-19 ENCOUNTER — Ambulatory Visit (INDEPENDENT_AMBULATORY_CARE_PROVIDER_SITE_OTHER): Payer: BC Managed Care – PPO | Admitting: Psychiatry

## 2011-10-19 DIAGNOSIS — Z630 Problems in relationship with spouse or partner: Secondary | ICD-10-CM

## 2011-10-19 DIAGNOSIS — F0630 Mood disorder due to known physiological condition, unspecified: Secondary | ICD-10-CM

## 2011-10-19 DIAGNOSIS — Z7189 Other specified counseling: Secondary | ICD-10-CM

## 2011-10-20 ENCOUNTER — Ambulatory Visit
Admission: RE | Admit: 2011-10-20 | Discharge: 2011-10-20 | Disposition: A | Discharge status: Home / Self Care | Payer: BC Managed Care – PPO | Source: Ambulatory Visit | Attending: Radiation Oncology | Admitting: Radiation Oncology

## 2011-10-20 ENCOUNTER — Encounter: Payer: Self-pay | Admitting: *Deleted

## 2011-10-20 ENCOUNTER — Encounter: Payer: Self-pay | Admitting: Radiation Oncology

## 2011-10-20 ENCOUNTER — Ambulatory Visit (INDEPENDENT_AMBULATORY_CARE_PROVIDER_SITE_OTHER): Payer: BC Managed Care – PPO | Admitting: Surgery

## 2011-10-20 ENCOUNTER — Encounter (INDEPENDENT_AMBULATORY_CARE_PROVIDER_SITE_OTHER): Payer: Self-pay | Admitting: Surgery

## 2011-10-20 VITALS — BP 99/66 | HR 91 | Resp 18 | Wt 156.4 lb

## 2011-10-20 VITALS — BP 104/72 | HR 80 | Temp 97.9°F | Resp 14 | Ht 66.0 in | Wt 155.2 lb

## 2011-10-20 DIAGNOSIS — C50419 Malignant neoplasm of upper-outer quadrant of unspecified female breast: Secondary | ICD-10-CM

## 2011-10-20 NOTE — Progress Notes (Signed)
10-19-2011 Patient seen for individual psychotherapy.  Session focused on coping skills.  Next appointment 11-03-2011.

## 2011-10-20 NOTE — Progress Notes (Signed)
Patient presents to the clinic today unaccompanied for a follow up appointment with Dr. Michell Heinrich. Patient is alert and oriented to person, place, and time. No distress noted. Steady gait noted. Pleasant affect noted. Patient denies pain at this time. Patient reports her right nipple and the back of her right axilla continue to be numb. Patient reports her right breast is very tender to the touch. Patient reports the skin of her right breast continues to by hyperpigmented but, is improving. Also, she reports the skin of her right breast continues to peel. Patient reports using Vitamin e body oil on her breast daily. Patient to follow up with Dr. Gaston Islam today. Reported all findings to Dr. Michell Heinrich.

## 2011-10-20 NOTE — Progress Notes (Signed)
Radiation Oncology         [phone]) [phone] ________________________________  Name: Casey Wall MRN: [id number]  Date: 10/20/2011  DOB: September 04, 1955  Follow-Up Visit Note  CC: Georgann Housekeeper, MD, MD  Currie Paris, MD  Diagnosis:   T1 N1 invasive ductal carcinoma of the right breast  Interval Since Last Radiation:  One month  Narrative:  The patient returns today for routine follow-up.  She is healed up well. She seen Dr. Noe Gens regularly which is helpful. She has some nipple numbness and some numbness in her armpit still. Her right breast is still tender and she has difficulty next touching that. Her skin is peeling. She is using lotion with vitamin E. He has appointment with Dr. Jamey Ripa later today. She met with the research nurses. She has an appointment with Dr. Donnie Coffin at the end of June. He is having some vertigo-type symptoms but she was having at the beginning of her treatments. She is to followup with her primary care physician regarding this.                             ALLERGIES:   has no known allergies.  Meds: Current Outpatient Prescriptions  Medication Sig Dispense Refill  . cetirizine (ZYRTEC) 10 MG tablet Take 10 mg by mouth as needed.       . Cholecalciferol (VITAMIN D PO) Take 1,000 mg by mouth daily.       . fluticasone (FLONASE) 50 MCG/ACT nasal spray Place 2 sprays into the nose as needed.       . Melatonin 1 MG CAPS Take 1 capsule by mouth at bedtime as needed. Pt. Not sure of dose      . Probiotic Product (SOLUBLE FIBER/PROBIOTICS PO) Take 1 capsule by mouth 3 (three) times daily.      Marland Kitchen tretinoin microspheres (RETIN-A MICRO) 0.04 % gel Apply topically at bedtime.       Marland Kitchen venlafaxine (EFFEXOR-XR) 37.5 MG 24 hr capsule Take 1 capsule (37.5 mg total) by mouth daily.  60 capsule  3  . zolpidem (AMBIEN) 5 MG tablet Take 1 tablet (5 mg total) by mouth at bedtime as needed for sleep.  30 tablet  1  . HYDROmorphone (DILAUDID) 4 MG tablet       . Polyethylene Glycol 3350  (MIRALAX PO) Take by mouth as needed.        . RABEprazole (ACIPHEX) 20 MG tablet Take 20 mg by mouth as needed. AM      . sucralfate (CARAFATE) 1 G tablet as needed.         Physical Findings: The patient is in no acute distress. Patient is alert and oriented. . her right breast is healing well. She still has some skin darkening in the lateral and inferior aspect of the breast. She has some edema around her nipple.  weight is 156 lb 6.4 oz (70.943 kg). Her blood pressure is 99/66 and her pulse is 91. Her respiration is 18. .  No significant changes.  Lab Findings: Lab Results  Component Value Date   WBC 4.7 06/13/2011   HGB 12.1 06/13/2011   HCT 35.3 06/13/2011   MCV 90.5 06/13/2011   PLT 385 06/13/2011      Radiographic Findings: No results found.  Impression:  The patient is recovering from the effects of radiation.    Plan:  I counseled her that she should go for another month and see if the  soreness improves. If it does not she may need some manual lymph drainage of the breast. I encouraged her to continue using her vitamin E. She has appointment Dr. Donnie Coffin to discuss antiestrogen therapy. He will continue her followup with Dr. Noe Gens. I will plan on seeing her back in 3 months. She knows she can cancel this visit if she feels like her breast pain is better. I also made an appointment in 6 months for her regular scheduled study visit.  _____________________________________

## 2011-10-20 NOTE — Patient Instructions (Signed)
See me again after your mammogram in October

## 2011-10-20 NOTE — Progress Notes (Signed)
NAME: Casey Wall       DOB: [birth year]           DATE: 10/20/2011       MRN: [id number]   EDDY TERMINE is a 56 y.o.Marland Kitchenfemale who presents for routine followup of her Right breast cancer diagnosed in 3012 and treated with lumpectomy and radiation, no chemo. She has no problems or concerns on either side.  PFSH: She has had no significant changes since the last visit here.  ROS: There have been no significant changes since the last visit here  EXAM: General: The patient is alert, oriented, generally healty appearing, NAD. Mood and affect are normal.  Breasts:  Right breast is smaller, slight increased pigmentation. Lumpectomy site is soft, not tender  Lymphatics: She has no axillary or supraclavicular adenopathy on either side.  Extremities: Full ROM of the surgical side with no lymphedema noted.  Data Reviewed: Epic notes  Impression: Doing well, with no evidence of recurrent cancer or new cancer  Plan: RTC six months. She sees Dr Donnie Coffin next month and will start an anti-estrogen at that time

## 2011-10-20 NOTE — Progress Notes (Signed)
10/20/11 at 1:03pm- The pt was into the cancer center this am for her 1 month post RT appointment.  The pt completed and returned her quality of life questionnaires.  The pt then had her photographs taken.  The pt has not experienced a recurrence of breast cancer.  She reports that her breast is healing well from her radiation.  She reports some itching and tingling of her breast.  She states that she is using a "body oil with vitamin E".  The pt is aware of her 2 months post RT appointment due in June 2013.  The pt was seen and examined by Dr. Michell Heinrich today.

## 2011-10-21 NOTE — Progress Notes (Signed)
Late entry from 10/20/2011 at 1130. Patient's blood pressure noted to be 99/66. Patient denies being lightheaded or dizziness but, reports she experienced a dizzy spell this morning that resolved. Remembered  that there was an occasion during treatment that patient experience a "dizzy spell." Notified Dr. Michell Heinrich of this finding. Strongly encouraged patient, again, to see her PCP reference this matter. Patient verbalized understanding.

## 2011-10-21 NOTE — Progress Notes (Signed)
Encounter addended by: Agnes Lawrence, RN on: 10/21/2011  5:34 PM<BR>     Documentation filed: Notes Section

## 2011-11-03 ENCOUNTER — Ambulatory Visit (INDEPENDENT_AMBULATORY_CARE_PROVIDER_SITE_OTHER): Payer: BC Managed Care – PPO | Admitting: Psychiatry

## 2011-11-03 DIAGNOSIS — F0630 Mood disorder due to known physiological condition, unspecified: Secondary | ICD-10-CM

## 2011-11-03 DIAGNOSIS — Z7189 Other specified counseling: Secondary | ICD-10-CM

## 2011-11-17 ENCOUNTER — Ambulatory Visit (INDEPENDENT_AMBULATORY_CARE_PROVIDER_SITE_OTHER): Payer: BC Managed Care – PPO | Admitting: Psychiatry

## 2011-11-17 DIAGNOSIS — F0630 Mood disorder due to known physiological condition, unspecified: Secondary | ICD-10-CM

## 2011-11-17 DIAGNOSIS — Z7189 Other specified counseling: Secondary | ICD-10-CM

## 2011-11-21 ENCOUNTER — Encounter: Payer: Self-pay | Admitting: *Deleted

## 2011-11-21 ENCOUNTER — Ambulatory Visit (HOSPITAL_BASED_OUTPATIENT_CLINIC_OR_DEPARTMENT_OTHER): Payer: BC Managed Care – PPO | Admitting: Oncology

## 2011-11-21 ENCOUNTER — Other Ambulatory Visit (HOSPITAL_BASED_OUTPATIENT_CLINIC_OR_DEPARTMENT_OTHER): Payer: BC Managed Care – PPO | Admitting: Lab

## 2011-11-21 ENCOUNTER — Telehealth: Payer: Self-pay | Admitting: *Deleted

## 2011-11-21 VITALS — BP 102/66 | HR 71 | Temp 98.6°F | Ht 66.0 in | Wt 153.7 lb

## 2011-11-21 DIAGNOSIS — C50419 Malignant neoplasm of upper-outer quadrant of unspecified female breast: Secondary | ICD-10-CM

## 2011-11-21 DIAGNOSIS — E559 Vitamin D deficiency, unspecified: Secondary | ICD-10-CM

## 2011-11-21 LAB — CBC WITH DIFFERENTIAL/PLATELET
BASO%: 0.8 % (ref 0.0–2.0)
EOS%: 2.7 % (ref 0.0–7.0)
HCT: 35.4 % (ref 34.8–46.6)
LYMPH%: 16.9 % (ref 14.0–49.7)
MCH: 30.6 pg (ref 25.1–34.0)
MCHC: 33.4 g/dL (ref 31.5–36.0)
NEUT%: 66.7 % (ref 38.4–76.8)
Platelets: 270 10*3/uL (ref 145–400)
RBC: 3.86 10*6/uL (ref 3.70–5.45)
lymph#: 0.7 10*3/uL — ABNORMAL LOW (ref 0.9–3.3)

## 2011-11-21 MED ORDER — VENLAFAXINE HCL ER 37.5 MG PO CP24: 75.0000 mg | ORAL_CAPSULE | Freq: Every day | ORAL | Status: DC

## 2011-11-21 MED ORDER — ANASTROZOLE 1 MG PO TABS: 1.0000 mg | ORAL_TABLET | Freq: Every day | ORAL | Status: AC

## 2011-11-21 MED ORDER — GABAPENTIN 300 MG PO CAPS: ORAL_CAPSULE | ORAL | Status: DC

## 2011-11-21 NOTE — Patient Instructions (Addendum)

## 2011-11-21 NOTE — Progress Notes (Signed)
11-17-2011  Patient seen for individual psychotherapy.  Patient and husband referred to Judithe Modest, MSW for marriage counseling.  Patient will call to schedule next appointment.

## 2011-11-21 NOTE — Progress Notes (Signed)
11/21/11 at 10:38am - [id number] [id number] ( 2 months post RT visit)- The pt is in the clinic for her 2 months post radiation follow up appointment.  The pt's photos were taken and will be uploaded tomorrow by the research assistant.  The pt states her breast has healed much over the past month.  She reports some itching.  (grade 1)  She also states that she is using vitamin E lotion on her affected breast.  The pt was seen and examined today by Dr. Donnie Coffin.  The pt is aware of her 6 months post RT follow up appointment on 03/22/12 with Dr. Michell Heinrich.  The pt was thanked for her continued support of the study.

## 2011-11-21 NOTE — Progress Notes (Signed)
Hematology and Oncology Follow Up Visit  Casey Wall [id number] Nov 11, 1955 56 y.o. 11/21/2011 10:57 AM   DIAGNOSIS:  Lumpectomy with sentinel node biopsy was performed on 06/01/11. Final pathology revealed 1.7 cm G1, tumor with clear surgical conditions. 1 of 2 sentinel lymph nodes was involved with a 0.8 cm focus of tumor.Oncotype score 14, risk of recurrence 9%., er  100%, pr 100%, ki67 8%, s/p xrt completed 09/21/11.   Encounter Diagnosis  Name Primary?  . Cancer of upper-outer quadrant of female breast Yes     PAST THERAPY:    Interim History: Patient returns for followup. She is doing relatively well. She tolerated the radiation fairly well. She did have some skin toxicity which is resolving. She is on Effexor which is helping hot flashes..peridin c  Is not helpful .  Medications: I have reviewed the patient's current medications.  Allergies: No Known Allergies  Past Medical History, Surgical history, Social history, and Family History were reviewed and updated.  Review of Systems: Constitutional:  Negative for fever, chills, night sweats, anorexia, weight loss, pain. Cardiovascular: no chest pain or dyspnea on exertion Respiratory: no cough, shortness of breath, or wheezing Neurological: negative Dermatological: negative ENT: negative Skin Gastrointestinal: negative Genito-Urinary: negative Hematological and Lymphatic: negative Breast: negative Musculoskeletal: negative Remaining ROS negative.  Physical Exam:  Blood pressure 102/66, pulse 71, temperature 98.6 F (37 C), height 5\' 6"  (1.676 m), weight 153 lb 11.2 oz (69.718 kg).  ECOG:    General appearance: alert, cooperative and appears stated age Eyes: conjunctivae/corneas clear. PERRL, EOM's intact. Fundi benign. Throat: lips, mucosa, and tongue normal; teeth and gums normal Resp: clear to auscultation bilaterally and normal percussion bilaterally Breasts: normal appearance, no masses or tenderness Cardio:  regular rate and rhythm, S1, S2 normal, no murmur, click, rub or gallop and normal apical impulse GI: soft, non-tender; bowel sounds normal; no masses,  no organomegaly Extremities: extremities normal, atraumatic, no cyanosis or edema Skin: Skin color, texture, turgor normal. No rashes or lesions Lymph nodes: Cervical, supraclavicular, and axillary nodes normal. Neurologic: Grossly normal   Lab Results: Lab Results  Component Value Date   WBC 4.1 11/21/2011   HGB 11.8 11/21/2011   HCT 35.4 11/21/2011   MCV 91.6 11/21/2011   PLT 270 11/21/2011     Chemistry      Component Value Date/Time   NA 139 06/13/2011 1639   K 3.8 06/13/2011 1639   CL 102 06/13/2011 1639   CO2 29 06/13/2011 1639   BUN 6 06/13/2011 1639   CREATININE 0.72 06/13/2011 1639      Component Value Date/Time   CALCIUM 9.8 06/13/2011 1639   ALKPHOS 67 06/13/2011 1639   AST 23 06/13/2011 1639   ALT 15 06/13/2011 1639   BILITOT 0.2* 06/13/2011 1639       Radiological Studies:  No results found.   IMPRESSIONS AND PLAN: A 56 y.o. female with   History of ER/PR positive T1 N1, breast cancer. She has completed radiation therapy. We discussed initiation of Arimidex. I discussed side effects. As far as her hot flashes are concerned I recommend increasing Effexor to 75 mg a day, I recommend taking Neurontin at nighttime 300 mg. I've also recommended that she also take vitamin D 2000 units daily given that her vitamin D level was low. She knows to call should she have any other concerns. I did indicate that hot flashes to get worse with Arimidex,  And she may experience joint pains. She has lost 15  pounds since coming off her patch. She is little concerned about that but I did indicate a BMI was good. That she will likely to equilibrate eventually and not continue to lose weight. I do feel that this is a function of taking a and estrogen patch .  Spent more than half the time coordinating care, as well as discussion of BMI and its  implications.      Casey Wall 6/24/201310:57 AM Cell [phone]

## 2011-11-21 NOTE — Telephone Encounter (Signed)
gave patient appointment for 01-20-2012 starting at 11:30am printed out calendar and gave to the patient

## 2011-11-22 LAB — COMPREHENSIVE METABOLIC PANEL
ALT: 11 U/L (ref 0–35)
AST: 21 U/L (ref 0–37)
Alkaline Phosphatase: 66 U/L (ref 39–117)
BUN: 10 mg/dL (ref 6–23)
Creatinine, Ser: 0.73 mg/dL (ref 0.50–1.10)

## 2011-11-22 LAB — VITAMIN D 25 HYDROXY (VIT D DEFICIENCY, FRACTURES): Vit D, 25-Hydroxy: 26 ng/mL — ABNORMAL LOW (ref 30–89)

## 2011-11-25 ENCOUNTER — Other Ambulatory Visit: Payer: Self-pay | Admitting: Oncology

## 2011-11-25 DIAGNOSIS — C50419 Malignant neoplasm of upper-outer quadrant of unspecified female breast: Secondary | ICD-10-CM

## 2011-12-19 ENCOUNTER — Encounter: Payer: Self-pay | Admitting: Dietician

## 2011-12-19 NOTE — Progress Notes (Signed)
Brief Out-patient Oncology Note  Reason: Patient screened positive for nutrition risk for unintentional weight loss and decreased appetite.   Casey Wall is a 56 year old female patient of Dr. Michell Heinrich, diagnosed with breast cancer. Attempted to contact patient via telephone for nutrition risk. Left patient voice mail with RD contact information.   Wt Readings from Last 10 Encounters:  11/21/11 153 lb 11.2 oz (69.718 kg)  10/20/11 155 lb 3.2 oz (70.398 kg)  10/20/11 156 lb 6.4 oz (70.943 kg)  09/20/11 160 lb 8 oz (72.802 kg)  09/13/11 161 lb 12.8 oz (73.392 kg)  09/06/11 160 lb 6.4 oz (72.757 kg)  09/02/11 160 lb 11.2 oz (72.893 kg)  08/30/11 160 lb 12.8 oz (72.938 kg)  08/23/11 161 lb (73.029 kg)  08/16/11 160 lb 9.6 oz (72.848 kg)   RD available for nutrition needs.   Iven Finn [facility] [phone]

## 2011-12-30 ENCOUNTER — Other Ambulatory Visit: Payer: Self-pay | Admitting: Gastroenterology

## 2012-01-20 ENCOUNTER — Ambulatory Visit (HOSPITAL_BASED_OUTPATIENT_CLINIC_OR_DEPARTMENT_OTHER): Payer: BC Managed Care – PPO | Admitting: Oncology

## 2012-01-20 ENCOUNTER — Other Ambulatory Visit (HOSPITAL_BASED_OUTPATIENT_CLINIC_OR_DEPARTMENT_OTHER): Payer: BC Managed Care – PPO

## 2012-01-20 VITALS — BP 97/67 | HR 88 | Temp 98.7°F | Resp 20 | Ht 66.0 in | Wt 150.2 lb

## 2012-01-20 DIAGNOSIS — Z170 Estrogen receptor positive status [ER+]: Secondary | ICD-10-CM

## 2012-01-20 DIAGNOSIS — C50419 Malignant neoplasm of upper-outer quadrant of unspecified female breast: Secondary | ICD-10-CM

## 2012-01-20 LAB — CBC WITH DIFFERENTIAL/PLATELET
Basophils Absolute: 0 10*3/uL (ref 0.0–0.1)
EOS%: 1 % (ref 0.0–7.0)
Eosinophils Absolute: 0 10*3/uL (ref 0.0–0.5)
HCT: 36.6 % (ref 34.8–46.6)
HGB: 12.3 g/dL (ref 11.6–15.9)
MCH: 30.9 pg (ref 25.1–34.0)
MCV: 91.6 fL (ref 79.5–101.0)
MONO%: 9 % (ref 0.0–14.0)
NEUT#: 3.1 10*3/uL (ref 1.5–6.5)
NEUT%: 70.3 % (ref 38.4–76.8)
Platelets: 263 10*3/uL (ref 145–400)

## 2012-01-20 NOTE — Progress Notes (Signed)
Hematology and Oncology Follow Up Visit  Casey Wall [id number] Dec 31, 1955 56 y.o. 01/20/2012 12:29 PM   DIAGNOSIS:  Lumpectomy with sentinel node biopsy was performed on 06/01/11. Final pathology revealed 1.7 cm G1, tumor with clear surgical conditions. 1 of 2 sentinel lymph nodes was involved with a 0.8 cm focus of tumor.Oncotype score 14, risk of recurrence 9%., er  100%, pr 100%, ki67 8%, s/p xrt completed 09/21/11.   No diagnosis found.   PAST THERAPY:    Interim History: Patient returns for followup. She is doing relatively well. She tolerated the radiation fairly well. She did have some skin toxicity which is resolving. She is on Effexor which is helping hot flashes..peridin c  Is not helpful .  Medications: I have reviewed the patient's current medications.  Allergies: No Known Allergies  Past Medical History, Surgical history, Social history, and Family History were reviewed and updated.  Review of Systems: Constitutional:  Negative for fever, chills, night sweats, anorexia, weight loss, pain. Cardiovascular: no chest pain or dyspnea on exertion Respiratory: no cough, shortness of breath, or wheezing Neurological: negative Dermatological: negative ENT: negative Skin Gastrointestinal: negative Genito-Urinary: negative Hematological and Lymphatic: negative Breast: negative Musculoskeletal: negative Remaining ROS negative.  Physical Exam:  Blood pressure 97/67, pulse 88, temperature 98.7 F (37.1 C), temperature source Oral, resp. rate 20, height 5\' 6"  (1.676 m), weight 150 lb 3.2 oz (68.13 kg).  ECOG:  0  General appearance: alert, cooperative and appears stated age Eyes: conjunctivae/corneas clear. PERRL, EOM's intact. Fundi benign. Throat: lips, mucosa, and tongue normal; teeth and gums normal Resp: clear to auscultation bilaterally and normal percussion bilaterally Breasts: lt breast has a persistent mass, please identify as a cyst on the mammogram from  January of this year. The right breast appears to be normal status post lumpectomy and radiation. Cardio: regular rate and rhythm, S1, S2 normal, no murmur, click, rub or gallop and normal apical impulse GI: soft, non-tender; bowel sounds normal; no masses,  no organomegaly Extremities: extremities normal, atraumatic, no cyanosis or edema Skin: Skin color, texture, turgor normal. No rashes or lesions Lymph nodes: Cervical, supraclavicular, and axillary nodes normal. Neurologic: Grossly normal   Lab Results: Lab Results  Component Value Date   WBC 4.4 01/20/2012   HGB 12.3 01/20/2012   HCT 36.6 01/20/2012   MCV 91.6 01/20/2012   PLT 263 01/20/2012     Chemistry      Component Value Date/Time   NA 138 11/21/2011 0949   K 3.8 11/21/2011 0949   CL 103 11/21/2011 0949   CO2 30 11/21/2011 0949   BUN 10 11/21/2011 0949   CREATININE 0.73 11/21/2011 0949      Component Value Date/Time   CALCIUM 9.4 11/21/2011 0949   ALKPHOS 66 11/21/2011 0949   AST 21 11/21/2011 0949   ALT 11 11/21/2011 0949   BILITOT 0.4 11/21/2011 0949       Radiological Studies:  No results found.   IMPRESSIONS AND PLAN: A 56 y.o. female with   History of ER/PR positive T1 N1, breast cancer. She has completed radiation therapy, and is on Rheumatrex as well as ongoing Effexor. She is not taking Neurontin. She finds Effexor and really helped her, her down and has help with her hot flashes and anxiety. She is working and doing well. She really has no other complaints. I will see her in 6 months time. I've asked her to get a followup mammogram in her local facility and results were forwarded to  me. Spent more than half the time coordinating care, as well as discussion of BMI and its implications.      Casey Wall 8/23/201312:29 PM Cell [phone]

## 2012-01-21 LAB — COMPREHENSIVE METABOLIC PANEL
AST: 19 U/L (ref 0–37)
Albumin: 4.1 g/dL (ref 3.5–5.2)
Alkaline Phosphatase: 72 U/L (ref 39–117)
BUN: 9 mg/dL (ref 6–23)
Calcium: 9.7 mg/dL (ref 8.4–10.5)
Creatinine, Ser: 0.72 mg/dL (ref 0.50–1.10)
Glucose, Bld: 93 mg/dL (ref 70–99)
Potassium: 4.1 mEq/L (ref 3.5–5.3)

## 2012-01-22 ENCOUNTER — Emergency Department (HOSPITAL_COMMUNITY)
Admission: EM | Admit: 2012-01-22 | Discharge: 2012-01-22 | Disposition: A | Discharge status: Home / Self Care | Payer: BC Managed Care – PPO | Attending: Emergency Medicine | Admitting: Emergency Medicine

## 2012-01-22 ENCOUNTER — Encounter (HOSPITAL_COMMUNITY): Payer: Self-pay | Admitting: *Deleted

## 2012-01-22 DIAGNOSIS — Z79899 Other long term (current) drug therapy: Secondary | ICD-10-CM | POA: Insufficient documentation

## 2012-01-22 DIAGNOSIS — T7840XA Allergy, unspecified, initial encounter: Secondary | ICD-10-CM | POA: Insufficient documentation

## 2012-01-22 DIAGNOSIS — Z8711 Personal history of peptic ulcer disease: Secondary | ICD-10-CM | POA: Insufficient documentation

## 2012-01-22 DIAGNOSIS — X58XXXA Exposure to other specified factors, initial encounter: Secondary | ICD-10-CM | POA: Insufficient documentation

## 2012-01-22 DIAGNOSIS — Z853 Personal history of malignant neoplasm of breast: Secondary | ICD-10-CM | POA: Insufficient documentation

## 2012-01-22 DIAGNOSIS — R21 Rash and other nonspecific skin eruption: Secondary | ICD-10-CM | POA: Insufficient documentation

## 2012-01-22 DIAGNOSIS — K5900 Constipation, unspecified: Secondary | ICD-10-CM | POA: Insufficient documentation

## 2012-01-22 DIAGNOSIS — K219 Gastro-esophageal reflux disease without esophagitis: Secondary | ICD-10-CM | POA: Insufficient documentation

## 2012-01-22 MED ORDER — PREDNISONE 20 MG PO TABS
60.0000 mg | ORAL_TABLET | Freq: Once | ORAL | Status: AC
  Administered 2012-01-22: 60 mg via ORAL
  Filled 2012-01-22: qty 3

## 2012-01-22 MED ORDER — PREDNISONE 20 MG PO TABS: 60.0000 mg | ORAL_TABLET | Freq: Every day | ORAL | Status: DC

## 2012-01-22 MED ORDER — DIPHENHYDRAMINE HCL 25 MG PO CAPS
25.0000 mg | ORAL_CAPSULE | Freq: Once | ORAL | Status: AC
  Administered 2012-01-22: 25 mg via ORAL
  Filled 2012-01-22: qty 1

## 2012-01-22 NOTE — ED Notes (Signed)
Patient is resting comfortably. States she is feeling a lot better . Rash appears to be resolving.

## 2012-01-22 NOTE — ED Notes (Signed)
Patient states she ate some blueberry pancakes this morning. After that she started itching all over and having hives on arms.

## 2012-01-22 NOTE — ED Provider Notes (Addendum)
History     CSN: [id number]  Arrival date & time 01/22/12  0910   First MD Initiated Contact with Patient 01/22/12 [phone]      Chief Complaint  Patient presents with  . Allergic Reaction    (Consider location/radiation/quality/duration/timing/severity/associated sxs/prior treatment) Patient is a 56 y.o. female presenting with allergic reaction. The history is provided by the patient.  Allergic Reaction The primary symptoms are  rash. The primary symptoms do not include shortness of breath, cough or dizziness.  pt c/o hives, throat tightness and sob after eating blueberry pancakes today.  She denied lightheadedness, or dizziness. She has never had a food allergy in the past.  She called the nurse hotline and was told to come to the emergency department for evaluation. She was given benadryl in the ed. Now her sxs are almost gone.  She only has mild itching now.   Past Medical History  Diagnosis Date  . Constipation   . Breast cancer 04/2011    right  . Seasonal allergies   . History of fibrocystic disease of breast   . Anemia   . Stomach ulcer 2012  . GERD (gastroesophageal reflux disease) 2012  . Cancer of upper-outer quadrant of female breast 05/10/2011    Past Surgical History  Procedure Date  . Total abdominal hysterectomy 1997  . Cesarean section 1980  . Cervical discectomy 2007  . Breast lumpectomy 06/01/11    right  . Breast cyst aspirated   . Herniated disc surgery 2007    neck    Family History  Problem Relation Age of Onset  . Lung cancer Father   . Cancer Father     lung  . Diabetes Mother   . Cancer Other     History  Substance Use Topics  . Smoking status: Never Smoker   . Smokeless tobacco: Never Used  . Alcohol Use: Yes     occasionally    OB History    Grav Para Term Preterm Abortions TAB SAB Ect Mult Living                  Review of Systems  HENT: Positive for trouble swallowing.   Respiratory: Negative for cough and shortness of  breath.   Skin: Positive for rash.       hives  Neurological: Negative for dizziness and light-headedness.  All other systems reviewed and are negative.    Allergies  Review of patient's allergies indicates no known allergies.  Home Medications   Current Outpatient Rx  Name Route Sig Dispense Refill  . VITAMIN D PO Oral Take 1,000 mg by mouth daily.     Marland Kitchen FLUTICASONE PROPIONATE 50 MCG/ACT NA SUSP Nasal Place 2 sprays into the nose as needed.     Marland Kitchen MELATONIN 1 MG PO CAPS Oral Take 1 capsule by mouth at bedtime as needed. Pt. Not sure of dose    . OMEPRAZOLE 20 MG PO CPDR Oral Take 20 mg by mouth daily.    Marland Kitchen SOLUBLE FIBER/PROBIOTICS PO Oral Take 1 capsule by mouth 3 (three) times daily.    . TRETINOIN MICROSPHERE 0.04 % EX GEL Topical Apply topically at bedtime.     . VENLAFAXINE HCL ER 37.5 MG PO CP24 Oral Take 37.5 mg by mouth daily.      BP 106/61  Pulse 90  Temp 97.9 F (36.6 C) (Axillary)  SpO2 100%  Physical Exam  Nursing note and vitals reviewed. Constitutional: She is oriented to person, place, and  time. She appears well-developed and well-nourished. No distress.  HENT:  Head: Normocephalic and atraumatic.  Mouth/Throat: Oropharynx is clear and moist.  Eyes: Conjunctivae are normal.  Neck: Normal range of motion. Neck supple.  Cardiovascular: Normal rate.   No murmur heard. Pulmonary/Chest: Effort normal. No respiratory distress. She has no wheezes.  Musculoskeletal: Normal range of motion.  Neurological: She is alert and oriented to person, place, and time.  Skin: Skin is warm and dry. No rash noted.  Psychiatric: She has a normal mood and affect. Thought content normal.    ED Course  Procedures (including critical care time) Allergic reaction without signs of anaphylaxis  Labs Reviewed - No data to display No results found.   No diagnosis found.  12:22 PM sxs resolved  MDM  Allergic reaction        Cheri Guppy, MD 01/22/12  1120  Cheri Guppy, MD 01/22/12 1222

## 2012-01-26 ENCOUNTER — Ambulatory Visit
Admission: RE | Admit: 2012-01-26 | Discharge: 2012-01-26 | Disposition: A | Discharge status: Home / Self Care | Payer: BC Managed Care – PPO | Source: Ambulatory Visit | Attending: Radiation Oncology | Admitting: Radiation Oncology

## 2012-01-26 ENCOUNTER — Ambulatory Visit: Payer: BC Managed Care – PPO | Admitting: Radiation Oncology

## 2012-01-26 ENCOUNTER — Encounter: Payer: Self-pay | Admitting: Radiation Oncology

## 2012-01-26 VITALS — BP 110/75 | HR 78 | Temp 98.0°F | Resp 20 | Wt 153.1 lb

## 2012-01-26 DIAGNOSIS — C50419 Malignant neoplasm of upper-outer quadrant of unspecified female breast: Secondary | ICD-10-CM

## 2012-01-26 NOTE — Progress Notes (Signed)
Pt reports right breast tenderness, also states she continues to  have numbness along right scapula, axilla area, some pain w/right shoulder.  Taking Arimidex. Had recent reaction to unknown food, caused swelling, rash, itchiness.

## 2012-01-26 NOTE — Progress Notes (Signed)
   Department of Radiation Oncology  Phone:  [phone] Fax:        [phone]   Name: HALIEGH KHURANA   DOB: [birth year]  MRN: [id number]    Date: 01/26/2012  Follow Up Visit Note  Diagnosis: T1cN1 Invasive ductal carcinoma of the right breast    Interval since last radiation: 4 months  Interval History: Pricella presents today for routine followup.  Her breast is feeling better. She still has some tenderness when she lies on her stomach or when she receives a big hard but other than that is feeling well. He is tolerating her Arimidex well. She continues to have some hot flashes but these are controlled with Effexor. She is struggling with survivorship issues.  Allergies: No Known Allergies  Medications:  Current Outpatient Prescriptions  Medication Sig Dispense Refill  . anastrozole (ARIMIDEX) 1 MG tablet       . Cholecalciferol (VITAMIN D PO) Take 1,000 mg by mouth daily.       . fluticasone (FLONASE) 50 MCG/ACT nasal spray Place 2 sprays into the nose as needed.       . Melatonin 1 MG CAPS Take 1 capsule by mouth at bedtime as needed. Pt. Not sure of dose      . omeprazole (PRILOSEC) 10 MG capsule Take 10 mg by mouth as needed.      . Probiotic Product (SOLUBLE FIBER/PROBIOTICS PO) Take 1 capsule by mouth 3 (three) times daily.      Marland Kitchen tretinoin microspheres (RETIN-A MICRO) 0.04 % gel Apply topically at bedtime.       Marland Kitchen venlafaxine XR (EFFEXOR-XR) 37.5 MG 24 hr capsule Take 37.5 mg by mouth daily.        Physical Exam:   weight is 153 lb 1.6 oz (69.446 kg). Her oral temperature is 98 F (36.7 C). Her blood pressure is 110/75 and her pulse is 78. Her respiration is 20.  Her breast looks great. She has a wonderful cosmetic outcome. She has some very minimal tanning over the right breast but not nearly as much tenderness as she did several months ago.  IMPRESSION: Danesha is a 56 y.o. female with a T1 C. N1 invasive ductal carcinoma the right breast 4 months out from treatment  with resolving right breast tenderness  PLAN:  Claudeen looks great. About this tenderness has resolved. I don't see a need to send her to physical therapy at this time. We talked about doing her mammograms which she normally has done October that she feels as though she might be still tender. I told her she could wait until after the first of the year if she wanted 2. We discussed our finding her new normal survivorship class. Hopefully she can signed up for this. I have not scheduled followup with me. She is regular scheduled followup with Dr. Jamey Ripa and Dr. Donnie Coffin. I be happy to see her back at any point on a when necessary basis.    Lurline Hare, MD

## 2012-03-01 ENCOUNTER — Other Ambulatory Visit: Payer: Self-pay | Admitting: Internal Medicine

## 2012-03-01 DIAGNOSIS — Z9889 Other specified postprocedural states: Secondary | ICD-10-CM

## 2012-03-01 DIAGNOSIS — Z853 Personal history of malignant neoplasm of breast: Secondary | ICD-10-CM

## 2012-03-21 ENCOUNTER — Encounter: Payer: Self-pay | Admitting: Radiation Oncology

## 2012-03-22 ENCOUNTER — Other Ambulatory Visit: Payer: Self-pay | Admitting: *Deleted

## 2012-03-22 ENCOUNTER — Telehealth: Payer: Self-pay | Admitting: *Deleted

## 2012-03-22 ENCOUNTER — Encounter: Payer: Self-pay | Admitting: *Deleted

## 2012-03-22 ENCOUNTER — Ambulatory Visit: Payer: BC Managed Care – PPO | Admitting: Radiation Oncology

## 2012-03-22 ENCOUNTER — Ambulatory Visit
Admission: RE | Admit: 2012-03-22 | Discharge: 2012-03-22 | Disposition: A | Discharge status: Home / Self Care | Payer: BC Managed Care – PPO | Source: Ambulatory Visit | Attending: Internal Medicine | Admitting: Internal Medicine

## 2012-03-22 ENCOUNTER — Encounter: Payer: Self-pay | Admitting: Radiation Oncology

## 2012-03-22 ENCOUNTER — Ambulatory Visit
Admission: RE | Admit: 2012-03-22 | Discharge: 2012-03-22 | Disposition: A | Discharge status: Home / Self Care | Payer: BC Managed Care – PPO | Source: Ambulatory Visit | Attending: Radiation Oncology | Admitting: Radiation Oncology

## 2012-03-22 VITALS — BP 107/73 | HR 89 | Temp 97.9°F | Resp 20 | Ht 66.0 in | Wt 149.7 lb

## 2012-03-22 DIAGNOSIS — Z9889 Other specified postprocedural states: Secondary | ICD-10-CM

## 2012-03-22 DIAGNOSIS — C50419 Malignant neoplasm of upper-outer quadrant of unspecified female breast: Secondary | ICD-10-CM

## 2012-03-22 DIAGNOSIS — Z853 Personal history of malignant neoplasm of breast: Secondary | ICD-10-CM

## 2012-03-22 HISTORY — DX: Adverse effect of antineoplastic and immunosuppressive drugs, initial encounter: T45.1X5A

## 2012-03-22 HISTORY — DX: Personal history of irradiation: Z92.3

## 2012-03-22 HISTORY — DX: Flushing: R23.2

## 2012-03-22 NOTE — Progress Notes (Signed)
Patient here f/u s/p rad txc 08/08/11-09/21/11 right breast, , alert,oriented x3, no c/opain or discomfort, eating and drinking well , Copywriter, advertising here, meds updated,  Mammogram done today,results in,

## 2012-03-22 NOTE — Telephone Encounter (Signed)
Schedule f/u for middle Feb and lab  07-11-2012 starting at 11:00am

## 2012-03-22 NOTE — Progress Notes (Signed)
Returns to clinic today for evaluation of her skin. She reports she is doing well. She has no breast related complaints. She had a negative mammogram this morning. Her skin is excellent. She has an excellent cosmetic result. 2 return in 1 year for study evaluation.

## 2012-03-22 NOTE — Progress Notes (Signed)
03/22/12 at 2:53pm- [facility] 97609 - 6 months post RT visit.  The pt was into the cancer center for her 6 months post RT assessment.  The pt's photos were taken.  The pt also completed her required questionnaires.  Dr. Michell Heinrich examined the pt and completed the RTOG SOMA evaluation.  The pt is continuing to take Arimidex daily.  She denies using any skin care products on her affected breast.  The pt's mammogram today confirmed that she has not had a recurrence of her breast cancer.  The pt was given her 12 months post RT assessment appointment for 03/22/12.  The research nurse contacted Dr. Renelda Loma nurse to schedule the pt's next follow up appointment with Dr. Donnie Coffin.

## 2012-03-26 ENCOUNTER — Other Ambulatory Visit: Payer: Self-pay | Admitting: *Deleted

## 2012-03-26 DIAGNOSIS — C50919 Malignant neoplasm of unspecified site of unspecified female breast: Secondary | ICD-10-CM

## 2012-03-26 MED ORDER — ANASTROZOLE 1 MG PO TABS: 1.0000 mg | ORAL_TABLET | Freq: Every day | ORAL | Status: DC

## 2012-03-29 ENCOUNTER — Ambulatory Visit: Payer: BC Managed Care – PPO | Admitting: Radiation Oncology

## 2012-04-26 ENCOUNTER — Ambulatory Visit: Payer: BC Managed Care – PPO | Admitting: Radiation Oncology

## 2012-05-25 ENCOUNTER — Other Ambulatory Visit: Payer: Self-pay | Admitting: Physician Assistant

## 2012-05-25 DIAGNOSIS — C50419 Malignant neoplasm of upper-outer quadrant of unspecified female breast: Secondary | ICD-10-CM

## 2012-06-26 ENCOUNTER — Telehealth: Payer: Self-pay | Admitting: *Deleted

## 2012-06-26 NOTE — Telephone Encounter (Signed)
Pt request Dr. Welton Flakes for f/u.  Confirmed new date and time for 07/09/12 3:30.

## 2012-06-27 NOTE — Progress Notes (Signed)
[facility] today.  Letter mailed to patient.  

## 2012-06-29 ENCOUNTER — Encounter: Payer: Self-pay | Admitting: Oncology

## 2012-07-09 ENCOUNTER — Ambulatory Visit (HOSPITAL_BASED_OUTPATIENT_CLINIC_OR_DEPARTMENT_OTHER): Payer: BC Managed Care – PPO | Admitting: Oncology

## 2012-07-09 ENCOUNTER — Encounter: Payer: Self-pay | Admitting: Oncology

## 2012-07-09 VITALS — BP 123/72 | HR 90 | Temp 97.8°F | Resp 20 | Ht 66.0 in | Wt 151.0 lb

## 2012-07-09 DIAGNOSIS — C50919 Malignant neoplasm of unspecified site of unspecified female breast: Secondary | ICD-10-CM

## 2012-07-09 DIAGNOSIS — N959 Unspecified menopausal and perimenopausal disorder: Secondary | ICD-10-CM

## 2012-07-09 DIAGNOSIS — Z170 Estrogen receptor positive status [ER+]: Secondary | ICD-10-CM

## 2012-07-09 DIAGNOSIS — C50419 Malignant neoplasm of upper-outer quadrant of unspecified female breast: Secondary | ICD-10-CM

## 2012-07-09 MED ORDER — VENLAFAXINE HCL ER 75 MG PO CP24: 75.0000 mg | ORAL_CAPSULE | Freq: Every day | ORAL | Status: DC

## 2012-07-09 MED ORDER — ANASTROZOLE 1 MG PO TABS: 1.0000 mg | ORAL_TABLET | Freq: Every day | ORAL | Status: DC

## 2012-07-09 NOTE — Patient Instructions (Addendum)
Continue arimidex 1 mg daily  Increase the dose of effexor 75 mg daily  I will see you back in 6 months

## 2012-07-09 NOTE — Progress Notes (Signed)
OFFICE PROGRESS NOTE  CC  HUSAIN,KARRAR, MD [address] [place] [address] Dr. Chipper Herb Dr. Cyndia Bent  DIAGNOSIS: 57 year old female with stage II a invasive ductal carcinoma of the right breast diagnosed December 2012.  PRIOR THERAPY:  #1 patient originally presented for a screening mammogram in [facility] that showed area of asymmetry on spot compressions within the axillary region of the right breast. Ultrasound showed a nonspecific mass measuring 1.2 cm. She had further evaluation at the breast Center and she was noted to have a 1.4 x 1.3 x 1.2 cm irregular hypoechoic mass within the right breast at the 10:00 position 15 cm from the nipple corresponding to the palpable abnormality.  #2 on 05/04/2011 patient had a biopsy performed that revealed invasive carcinoma with associated calcifications. The tumor was strongly ER positive PR positive with a proliferation marker Ki-67 only 8% and HER-2/neu negative.  #3 patient had breast MRIs performed on 05/09/2011 that showed solitary enhancing mass within the upper outer quadrant of the right breast with multiple subcentimeter lesions within the liver suspected to be cysts.  #4 patient went on to have a right partial mastectomy with sentinel lymph node biopsy on 06/01/2011. The final pathology revealed a 1.7 cm low-grade invasive ductal carcinoma with lymphovascular invasion one of 2 sentinel lymph nodes were positive for metastatic disease with the deposit measuring 0.8 cm no extracapsular extension.   #5 patient had Oncotype DX testing performed her breast cancer recurrence score was 14 giving her a 5 year risk of distant recurrence at 9% and therefore she was in the low risk category. She was randomized to antiestrogen therapy alone.  #7 patient received radiation therapy to the breast from March 2013 - 09/21/2011.  #8 she was then begun on Arimidex 1 mg daily starting May 2013  CURRENT THERAPY: Arimidex  1 mg daily/radiation [id number] [facility] study  INTERVAL HISTORY: Casey Wall 57 y.o. female returns for followup visit today. Clinically she seems to be doing well. She has no evidence of recurrent disease. Patient is on Arimidex doing well with it but she is having significant hot flashes. She has been taking Effexor 37.5 mg with some relief. I have recommended that she go ahead and increase the dose to 75 mg on a daily basis. She otherwise denies any nausea vomiting fevers chills headaches. She has no myalgias and arthralgias. Remainder of the 10 point review of systems is negative.  MEDICAL HISTORY: Past Medical History  Diagnosis Date  . Constipation   . Breast cancer 04/2011    right  . Seasonal allergies   . History of fibrocystic disease of breast   . Anemia   . Stomach ulcer 2012  . GERD (gastroesophageal reflux disease) 2012  . Cancer of upper-outer quadrant of female breast 05/10/2011  . Status post radiation therapy 08/08/11 - 09/21/11    Right Breast: 50.4 Gy/28 Fractions: Boost 10 Gy/ 5 Fractions  . Hot flashes related to aromatase inhibitor therapy     Arimidex    ALLERGIES:  has No Known Allergies.  MEDICATIONS:  Current Outpatient Prescriptions  Medication Sig Dispense Refill  . anastrozole (ARIMIDEX) 1 MG tablet Take 1 tablet (1 mg total) by mouth daily.  30 tablet  3  . Cholecalciferol (VITAMIN D PO) Take 1,000 mg by mouth daily.       . fluticasone (FLONASE) 50 MCG/ACT nasal spray Place 2 sprays into the nose as needed.       Marland Kitchen  Melatonin 1 MG CAPS Take 1 capsule by mouth at bedtime as needed. Pt. Not sure of dose      . omeprazole (PRILOSEC) 10 MG capsule Take 10 mg by mouth as needed.      . Probiotic Product (SOLUBLE FIBER/PROBIOTICS PO) Take 1 capsule by mouth daily.       Marland Kitchen tretinoin microspheres (RETIN-A MICRO) 0.04 % gel Apply topically at bedtime.       Marland Kitchen venlafaxine XR (EFFEXOR-XR) 37.5 MG 24 hr capsule TAKE ONE CAPSULE BY MOUTH EVERY DAY MAY INCREASE TO  2 CAPSULES DAILY  60 capsule  PRN   No current facility-administered medications for this visit.    SURGICAL HISTORY:  Past Surgical History  Procedure Laterality Date  . Total abdominal hysterectomy  1997  . Cesarean section  1980  . Cervical discectomy  2007  . Breast lumpectomy  06/01/11    right  . Breast cyst aspirated    . Herniated disc surgery  2007    neck    REVIEW OF SYSTEMS:  A comprehensive review of systems was negative.   HEALTH MAINTENANCE:  PHYSICAL EXAMINATION: Blood pressure 123/72, pulse 90, temperature 97.8 F (36.6 C), temperature source Oral, resp. rate 20, height 5\' 6"  (1.676 m), weight 151 lb (68.493 kg). Body mass index is 24.38 kg/(m^2). ECOG PERFORMANCE STATUS: 1 - Symptomatic but completely ambulatory   General appearance: alert, cooperative and appears stated age Lymph nodes: Cervical, supraclavicular, and axillary nodes normal. Resp: clear to auscultation bilaterally Back: symmetric, no curvature. ROM normal. No CVA tenderness. Cardio: regular rate and rhythm GI: soft, non-tender; bowel sounds normal; no masses,  no organomegaly Extremities: extremities normal, atraumatic, no cyanosis or edema Neurologic: Grossly normal Right breast exam well-healed surgical scar no masses nodularity no nipple discharge.   LABORATORY DATA: Lab Results  Component Value Date   WBC 4.4 01/20/2012   HGB 12.3 01/20/2012   HCT 36.6 01/20/2012   MCV 91.6 01/20/2012   PLT 263 01/20/2012      Chemistry      Component Value Date/Time   NA 139 01/20/2012 1136   K 4.1 01/20/2012 1136   CL 103 01/20/2012 1136   CO2 30 01/20/2012 1136   BUN 9 01/20/2012 1136   CREATININE 0.72 01/20/2012 1136      Component Value Date/Time   CALCIUM 9.7 01/20/2012 1136   ALKPHOS 72 01/20/2012 1136   AST 19 01/20/2012 1136   ALT 11 01/20/2012 1136   BILITOT 0.2* 01/20/2012 1136    06/01/11 ADDITIONAL INFORMATION: 1. A sample was sent to [facility] for oncotype testing. The patient's  recurrence score is 14. Those patients who had a recurrence score of 14 had an average rate of distant recurrence of 9%. (JBK:eps 07/12/11) Pecola Leisure MD Pathologist, Electronic Signature ( Signed 07/12/2011) 1. CHROMOGENIC IN-SITU HYBRIDIZATION Interpretation HER-2/NEU BY CISH - NO AMPLIFICATION OF HER-2 DETECTED. THE RATIO OF HER-2: CEP 17 SIGNALS WAS 1.56. Reference range: Ratio: HER2:CEP17 < 1.8 - gene amplification not observed Ratio: HER2:CEP 17 1.8-2.2 - equivocal result Ratio: HER2:CEP17 > 2.2 - gene amplification observed Pecola Leisure MD Pathologist, Electronic Signature ( Signed 06/07/2011) FINAL DIAGNOSIS 1 of 4 FINAL for Maxfield, Maysen B ([id number]) Diagnosis 1. Breast, lumpectomy, right - INVASIVE WELL-DIFFERENTIATED LOW-GRADE DUCTAL CARCINOMA (1.7 CM). - LYMPHOVASCULAR INVASION IDENTIFIED. - INVASIVE TUMOR 0.9 CM FROM NEAREST MARGIN (ANTERIOR). - PREVIOUS BIOPSY SITE IDENTIFIED. - SEE TUMOR SYNOPTIC TEMPLATE BELOW. 2. Lymph node, sentinel, biopsy, #1 - ONE LYMPH NODE, NEGATIVE  FOR TUMOR (0/1). 3. Lymph node, sentinel, biopsy, #2 - ONE LYMPH NODE, POSITIVE FOR METASTATIC MAMMARY CARCINOMA (1/1). - TUMOR DEPOSIT IS 0.8 CM. - NO EXTRACAPSULAR TUMOR EXTENSION. Microscopic Comment 1. BREAST, INVASIVE TUMOR, WITH LYMPH NODE SAMPLING Specimen, including laterality: Right breast Procedure: Lumpectomy Grade: I of III Tubule formation: 1 Nuclear pleomorphism: 1 Mitotic:1 Tumor size (gross measurement ) 1.7 cm Margins: Invasive, distance to closest margin: 0.9 cm In-situ, distance to closest margin: N/A If margin positive, focally or broadly: N/A Lymphovascular invasion: Present Ductal carcinoma in situ: Absent Grade: N/A Extensive intraductal component: N/A Lobular neoplasia: Absent Tumor focality: Unifocal Treatment effect: None If present, treatment effect in breast tissue, lymph nodes or both: N/A Extent of tumor: Skin: N/A Nipple: N/A Skeletal muscle:  N/A Lymph nodes: # examined: 2 Lymph nodes with metastasis: 1 Macrometastasis: (> 2.0 mm): 1 lymph node with 8 mm tumor deposit Extracapsular extension: Absent Breast prognostic profile: Estrogen receptor: Not repeated, previous study demonstrates 100% positivity ([id number]). Progesterone receptor: Not repeated, previous study demonstrates 100% positivity ([id number]). Her 2 neu: Repeated, previous study demonstrated no amplification (1.33) ([id number]). Ki-67: Not repeated, previous study demonstrated 8% proliferation rate ([id number]). Non-neoplastic breast: Fibrocystic change, sclerosing adenosis, previous biopsy site, and microcalcifications and benign ducts and lobules. TNM: pT1c, pN1a Comments: None. (CR:mw 06/02/11) 2 of 4   RADIOGRAPHIC STUDIES:  No results found.  ASSESSMENT: 57 year old female with  #1 node positive stage I invasive ductal carcinoma of the right breast originally diagnosed in December 2012. Patient underwent a partial mastectomy with sentinel lymph node biopsy on 06/01/2011. The final pathology revealed a 1.7 cm low-grade invasive ductal carcinoma with lymphovascular invasion one of 2 sentinel nodes were positive for metastatic disease.  #2 she went on to receive radiation therapy from March 2013 to April 2013.  #3 her Oncotype DX score was low and she went on on antiestrogen therapy consisting of Arimidex 1 mg daily starting in May 2013. Except for hot flashes she is tolerating it well.   PLAN:   #1 patient will continue Arimidex 1 mg daily.  #2 we will increase the dose of the Effexor to 75 mg daily.  #3 I will see her back in 6 months time for followup.   All questions were answered. The patient knows to call the clinic with any problems, questions or concerns. We can certainly see the patient much sooner if necessary.  I spent 40 minutes counseling the patient face to face. The total time spent in the appointment was 40  minutes.    Drue Second, MD Medical/Oncology [facility] [phone] (beeper) [phone] (Office)  07/09/2012, 3:34 PM

## 2012-07-11 ENCOUNTER — Other Ambulatory Visit: Payer: BC Managed Care – PPO | Admitting: Lab

## 2012-07-11 ENCOUNTER — Ambulatory Visit: Payer: BC Managed Care – PPO | Admitting: Oncology

## 2012-07-12 ENCOUNTER — Telehealth: Payer: Self-pay | Admitting: Oncology

## 2012-07-12 NOTE — Telephone Encounter (Signed)
S/w pt re appt for 8/14

## 2012-07-25 ENCOUNTER — Telehealth: Payer: Self-pay | Admitting: Medical Oncology

## 2012-07-25 NOTE — Telephone Encounter (Signed)
Pt LVMOM stating she "wants a prescription for zpack, I have a sinus infection and a cold." Per MD, patient is to call her PCP regarding these symptoms and prescription request. Returned call to pt and LVMOM. Patient to call with any other questions or concerns.

## 2012-08-21 ENCOUNTER — Other Ambulatory Visit: Payer: Self-pay | Admitting: *Deleted

## 2012-08-21 DIAGNOSIS — C50919 Malignant neoplasm of unspecified site of unspecified female breast: Secondary | ICD-10-CM

## 2012-08-21 MED ORDER — VENLAFAXINE HCL ER 75 MG PO CP24: 75.0000 mg | ORAL_CAPSULE | Freq: Every day | ORAL | Status: DC

## 2012-09-20 ENCOUNTER — Encounter: Payer: Self-pay | Admitting: Radiation Oncology

## 2012-09-20 ENCOUNTER — Ambulatory Visit
Admission: RE | Admit: 2012-09-20 | Discharge: 2012-09-20 | Disposition: A | Discharge status: Home / Self Care | Payer: BC Managed Care – PPO | Source: Ambulatory Visit | Attending: Radiation Oncology | Admitting: Radiation Oncology

## 2012-09-20 ENCOUNTER — Encounter: Payer: Self-pay | Admitting: *Deleted

## 2012-09-20 VITALS — BP 113/74 | HR 93 | Temp 98.3°F | Resp 20 | Wt 150.9 lb

## 2012-09-20 DIAGNOSIS — C50411 Malignant neoplasm of upper-outer quadrant of right female breast: Secondary | ICD-10-CM

## 2012-09-20 NOTE — Progress Notes (Signed)
   Department of Radiation Oncology  Phone:  [phone] Fax:        [phone]   Name: Casey Wall MRN: [id number]  DOB: 1955/07/22  Date: 09/20/2012  Follow Up Visit Note  Diagnosis: T1cN1 invasive ductal carcinoma of the right breast  Summary and Interval since last radiation: One year (60.4 gray completed 09/21/2011)  Interval History: Treasa presents today for routine followup.  She is feeling well and doing well. She has no breast related complaints. She feels she is an excellent cosmetic result. She's not having any of the hardness of her breast that she had previously. She had a mammogram in October which was negative. She is seeing Dr. Welton Flakes in August. She continues on Arimidex. She is working full-time. Her aunts has recently been diagnosed with breast cancer.  Allergies: No Known Allergies  Medications:  Current Outpatient Prescriptions  Medication Sig Dispense Refill  . anastrozole (ARIMIDEX) 1 MG tablet Take 1 tablet (1 mg total) by mouth daily.  90 tablet  12  . Cholecalciferol (VITAMIN D PO) Take 1,000 mg by mouth daily.       . fluticasone (FLONASE) 50 MCG/ACT nasal spray Place 2 sprays into the nose as needed.       . Melatonin 1 MG CAPS Take 1 capsule by mouth at bedtime as needed. Pt. Not sure of dose      . pantoprazole (PROTONIX) 40 MG tablet       . Probiotic Product (SOLUBLE FIBER/PROBIOTICS PO) Take 1 capsule by mouth daily.       Marland Kitchen tretinoin microspheres (RETIN-A MICRO) 0.04 % gel Apply topically at bedtime.       Marland Kitchen venlafaxine XR (EFFEXOR-XR) 75 MG 24 hr capsule Take 1 capsule (75 mg total) by mouth daily.  90 capsule  1   No current facility-administered medications for this encounter.    Physical Exam:  Filed Vitals:   09/20/12 1348  BP: 113/74  Pulse: 93  Temp: 98.3 F (36.8 C)  Resp: 20   her right breast is slightly dark as compared to her left. She still has some peri-areolar edema. No palpable abnormalities of the right breast. No  palpable abnormalities of the left breast.  IMPRESSION: Videl is a 57 y.o. female status post breast conservation with an excellent cosmetic result  PLAN:  Ekaterina is doing well. I'm glad she is feeling much better. We talked a little bit about the anxiety related to new breast cancer research or others diagnosis. She continues on Arimidex. I will plan on seeing her back on a when necessary basis. She is regular scheduled followup with medical oncology. Research paperwork was completed today which completes her enrollment in the [facility] study.    Lurline Hare, MD

## 2012-09-20 NOTE — Progress Notes (Signed)
Pt denies pain, fatigue, loss of appetite. She reports that occasionally her nipple gets sensitive, firm to touch but resolves on it's own. She states this has been occuring since surgery.  Taking Arimidex daily. Last mammogram Oct 2013.  Pt working full time.

## 2012-09-20 NOTE — Progress Notes (Signed)
09/20/12 at 2:31pm - [facility] 97609 - 12 months post RT visit - The pt was into the cancer center this afternoon for her final assessment on the [facility] 97609 study.  The pt completed her 12 months post RT questionnaires in the clinic today.  The pt also had her photos taken and uploaded to the database.  The pt was seen and examined today by Dr. Michell Heinrich.  Dr. Michell Heinrich completed the RTOG SOMA Skin Reaction form.  The pt confirmed that she is still taking her anastrozole daily.  She denies any current breast problems.  She did mention that she has some episodic nipple fullness.  Dr. Michell Heinrich told her that this was just fluid.  The pt is very excited about the birth of her first grandchild this summer.  She is not using any skin care products on her affected breast.  The pt has not had a recurrence of her breast cancer.  The pt was thanked for her support of this clinical trial.

## 2013-01-10 ENCOUNTER — Encounter: Payer: 59 | Admitting: Oncology

## 2013-01-10 ENCOUNTER — Other Ambulatory Visit (HOSPITAL_BASED_OUTPATIENT_CLINIC_OR_DEPARTMENT_OTHER): Payer: 59 | Admitting: Lab

## 2013-01-10 ENCOUNTER — Telehealth: Payer: Self-pay | Admitting: Oncology

## 2013-01-10 DIAGNOSIS — C50919 Malignant neoplasm of unspecified site of unspecified female breast: Secondary | ICD-10-CM

## 2013-01-10 DIAGNOSIS — C50419 Malignant neoplasm of upper-outer quadrant of unspecified female breast: Secondary | ICD-10-CM

## 2013-01-10 LAB — CBC WITH DIFFERENTIAL/PLATELET
Basophils Absolute: 0 10*3/uL (ref 0.0–0.1)
Eosinophils Absolute: 0.1 10*3/uL (ref 0.0–0.5)
HGB: 12.1 g/dL (ref 11.6–15.9)
MCV: 91 fL (ref 79.5–101.0)
MONO#: 0.4 10*3/uL (ref 0.1–0.9)
MONO%: 12.1 % (ref 0.0–14.0)
NEUT#: 1.7 10*3/uL (ref 1.5–6.5)
RBC: 3.92 10*6/uL (ref 3.70–5.45)
RDW: 12.2 % (ref 11.2–14.5)
WBC: 3.4 10*3/uL — ABNORMAL LOW (ref 3.9–10.3)
lymph#: 1.2 10*3/uL (ref 0.9–3.3)

## 2013-01-10 LAB — COMPREHENSIVE METABOLIC PANEL (CC13)
ALT: 15 U/L (ref 0–55)
Albumin: 3.8 g/dL (ref 3.5–5.0)
CO2: 29 mEq/L (ref 22–29)
Potassium: 3.9 mEq/L (ref 3.5–5.1)
Sodium: 142 mEq/L (ref 136–145)
Total Bilirubin: 0.27 mg/dL (ref 0.20–1.20)
Total Protein: 7.7 g/dL (ref 6.4–8.3)

## 2013-01-10 NOTE — Progress Notes (Signed)
1100--Patient here today for visit, an hour late, patient will need to reschedule MD visit to next available. Labs to be drawn today. Casey Wall in registration will send patient to scheduling. 1130--Patient states she will wait until she can be worked in.

## 2013-01-10 NOTE — Telephone Encounter (Signed)
, °

## 2013-01-11 LAB — VITAMIN D 25 HYDROXY (VIT D DEFICIENCY, FRACTURES): Vit D, 25-Hydroxy: 44 ng/mL (ref 30–89)

## 2013-01-21 NOTE — Progress Notes (Signed)
This encounter was created in error - please disregard.

## 2013-02-18 ENCOUNTER — Encounter: Payer: Self-pay | Admitting: Oncology

## 2013-02-18 ENCOUNTER — Ambulatory Visit (HOSPITAL_BASED_OUTPATIENT_CLINIC_OR_DEPARTMENT_OTHER): Payer: 59 | Admitting: Oncology

## 2013-02-18 VITALS — BP 115/68 | HR 96 | Temp 99.0°F | Resp 20 | Ht 66.0 in | Wt 157.1 lb

## 2013-02-18 DIAGNOSIS — C50419 Malignant neoplasm of upper-outer quadrant of unspecified female breast: Secondary | ICD-10-CM

## 2013-02-18 DIAGNOSIS — C50411 Malignant neoplasm of upper-outer quadrant of right female breast: Secondary | ICD-10-CM

## 2013-02-18 NOTE — Patient Instructions (Addendum)
Continue arimidex 1 mg daily  We will see you back in 6 months 

## 2013-02-18 NOTE — Progress Notes (Signed)
OFFICE PROGRESS NOTE  CC  HUSAIN,KARRAR, MD [address] [place] [address] Dr. Chipper Herb Dr. Cyndia Bent  DIAGNOSIS: 57 year old female with stage II a invasive ductal carcinoma of the right breast diagnosed December 2012.  PRIOR THERAPY:  #1 patient originally presented for a screening mammogram in [facility] that showed area of asymmetry on spot compressions within the axillary region of the right breast. Ultrasound showed a nonspecific mass measuring 1.2 cm. She had further evaluation at the breast Center and she was noted to have a 1.4 x 1.3 x 1.2 cm irregular hypoechoic mass within the right breast at the 10:00 position 15 cm from the nipple corresponding to the palpable abnormality.  #2 on 05/04/2011 patient had a biopsy performed that revealed invasive carcinoma with associated calcifications. The tumor was strongly ER positive PR positive with a proliferation marker Ki-67 only 8% and HER-2/neu negative.  #3 patient had breast MRIs performed on 05/09/2011 that showed solitary enhancing mass within the upper outer quadrant of the right breast with multiple subcentimeter lesions within the liver suspected to be cysts.  #4 patient went on to have a right partial mastectomy with sentinel lymph node biopsy on 06/01/2011. The final pathology revealed a 1.7 cm low-grade invasive ductal carcinoma with lymphovascular invasion one of 2 sentinel lymph nodes were positive for metastatic disease with the deposit measuring 0.8 cm no extracapsular extension.   #5 patient had Oncotype DX testing performed her breast cancer recurrence score was 14 giving her a 5 year risk of distant recurrence at 9% and therefore she was in the low risk category. She was randomized to antiestrogen therapy alone.  #7 patient received radiation therapy to the breast from March 2013 - 09/21/2011.  #8 she was then begun on Arimidex 1 mg daily starting May 2013  CURRENT THERAPY: Arimidex  1 mg daily/radiation [id number] [facility] study  INTERVAL HISTORY: Casey Wall 57 y.o. female returns for followup visit today. Clinically she seems to be doing well. She has no evidence of recurrent disease. Patient is on Arimidex doing well. with itShe otherwise denies any nausea vomiting fevers chills headaches. She does state that she's been having pain at the incisions site more so than normal. She's been taking ibuprofen with some relief. She also tells me that when she puts on her sleeve the pain improves. I do think this could be just related to her nerves. She has no myalgias and arthralgias. Remainder of the 10 point review of systems is negative.  MEDICAL HISTORY: Past Medical History  Diagnosis Date  . Constipation   . Breast cancer 04/2011    right  . Seasonal allergies   . History of fibrocystic disease of breast   . Anemia   . Stomach ulcer 2012  . GERD (gastroesophageal reflux disease) 2012  . Cancer of upper-outer quadrant of female breast 05/10/2011  . Status post radiation therapy 08/08/11 - 09/21/11    Right Breast: 50.4 Gy/28 Fractions: Boost 10 Gy/ 5 Fractions  . Hot flashes related to aromatase inhibitor therapy     Arimidex    ALLERGIES:  has No Known Allergies.  MEDICATIONS:  Current Outpatient Prescriptions  Medication Sig Dispense Refill  . anastrozole (ARIMIDEX) 1 MG tablet Take 1 tablet (1 mg total) by mouth daily.  90 tablet  12  . Cholecalciferol (VITAMIN D PO) Take 1,000 mg by mouth daily.       . fluticasone (FLONASE) 50 MCG/ACT nasal spray Place 2 sprays  into the nose as needed.       . Melatonin 1 MG CAPS Take 1 capsule by mouth at bedtime as needed. Pt. Not sure of dose      . pantoprazole (PROTONIX) 40 MG tablet       . Probiotic Product (SOLUBLE FIBER/PROBIOTICS PO) Take 1 capsule by mouth daily.       Marland Kitchen tretinoin microspheres (RETIN-A MICRO) 0.04 % gel Apply topically at bedtime.       Marland Kitchen venlafaxine XR (EFFEXOR-XR) 75 MG 24 hr capsule Take 1  capsule (75 mg total) by mouth daily.  90 capsule  1   No current facility-administered medications for this visit.    SURGICAL HISTORY:  Past Surgical History  Procedure Laterality Date  . Total abdominal hysterectomy  1997  . Cesarean section  1980  . Cervical discectomy  2007  . Breast lumpectomy  06/01/11    right  . Breast cyst aspirated    . Herniated disc surgery  2007    neck    REVIEW OF SYSTEMS:  A comprehensive review of systems was negative.   HEALTH MAINTENANCE:  PHYSICAL EXAMINATION: Blood pressure 115/68, pulse 96, temperature 99 F (37.2 C), temperature source Oral, resp. rate 20, height 5\' 6"  (1.676 m), weight 157 lb 1.6 oz (71.26 kg). Body mass index is 25.37 kg/(m^2). ECOG PERFORMANCE STATUS: 1 - Symptomatic but completely ambulatory   General appearance: alert, cooperative and appears stated age Lymph nodes: Cervical, supraclavicular, and axillary nodes normal. Resp: clear to auscultation bilaterally Back: symmetric, no curvature. ROM normal. No CVA tenderness. Cardio: regular rate and rhythm GI: soft, non-tender; bowel sounds normal; no masses,  no organomegaly Extremities: extremities normal, atraumatic, no cyanosis or edema Neurologic: Grossly normal Right breast exam well-healed surgical scar no masses nodularity no nipple discharge.   LABORATORY DATA: Lab Results  Component Value Date   WBC 3.4* 01/10/2013   HGB 12.1 01/10/2013   HCT 35.7 01/10/2013   MCV 91.0 01/10/2013   PLT 266 01/10/2013      Chemistry      Component Value Date/Time   NA 142 01/10/2013 1109   NA 139 01/20/2012 1136   K 3.9 01/10/2013 1109   K 4.1 01/20/2012 1136   CL 103 01/20/2012 1136   CO2 29 01/10/2013 1109   CO2 30 01/20/2012 1136   BUN 6.3* 01/10/2013 1109   BUN 9 01/20/2012 1136   CREATININE 0.8 01/10/2013 1109   CREATININE 0.72 01/20/2012 1136      Component Value Date/Time   CALCIUM 9.8 01/10/2013 1109   CALCIUM 9.7 01/20/2012 1136   ALKPHOS 87 01/10/2013 1109    ALKPHOS 72 01/20/2012 1136   AST 27 01/10/2013 1109   AST 19 01/20/2012 1136   ALT 15 01/10/2013 1109   ALT 11 01/20/2012 1136   BILITOT 0.27 01/10/2013 1109   BILITOT 0.2* 01/20/2012 1136    06/01/11 ADDITIONAL INFORMATION: 1. A sample was sent to [facility] for oncotype testing. The patient's recurrence score is 14. Those patients who had a recurrence score of 14 had an average rate of distant recurrence of 9%. (JBK:eps 07/12/11) Pecola Leisure MD Pathologist, Electronic Signature ( Signed 07/12/2011) 1. CHROMOGENIC IN-SITU HYBRIDIZATION Interpretation HER-2/NEU BY CISH - NO AMPLIFICATION OF HER-2 DETECTED. THE RATIO OF HER-2: CEP 17 SIGNALS WAS 1.56. Reference range: Ratio: HER2:CEP17 < 1.8 - gene amplification not observed Ratio: HER2:CEP 17 1.8-2.2 - equivocal result Ratio: HER2:CEP17 > 2.2 - gene amplification observed Pecola Leisure MD Pathologist,  Electronic Signature ( Signed 06/07/2011) FINAL DIAGNOSIS 1 of 4 FINAL for Yearick, Deandra B ([id number]) Diagnosis 1. Breast, lumpectomy, right - INVASIVE WELL-DIFFERENTIATED LOW-GRADE DUCTAL CARCINOMA (1.7 CM). - LYMPHOVASCULAR INVASION IDENTIFIED. - INVASIVE TUMOR 0.9 CM FROM NEAREST MARGIN (ANTERIOR). - PREVIOUS BIOPSY SITE IDENTIFIED. - SEE TUMOR SYNOPTIC TEMPLATE BELOW. 2. Lymph node, sentinel, biopsy, #1 - ONE LYMPH NODE, NEGATIVE FOR TUMOR (0/1). 3. Lymph node, sentinel, biopsy, #2 - ONE LYMPH NODE, POSITIVE FOR METASTATIC MAMMARY CARCINOMA (1/1). - TUMOR DEPOSIT IS 0.8 CM. - NO EXTRACAPSULAR TUMOR EXTENSION. Microscopic Comment 1. BREAST, INVASIVE TUMOR, WITH LYMPH NODE SAMPLING Specimen, including laterality: Right breast Procedure: Lumpectomy Grade: I of III Tubule formation: 1 Nuclear pleomorphism: 1 Mitotic:1 Tumor size (gross measurement ) 1.7 cm Margins: Invasive, distance to closest margin: 0.9 cm In-situ, distance to closest margin: N/A If margin positive, focally or broadly: N/A Lymphovascular invasion:  Present Ductal carcinoma in situ: Absent Grade: N/A Extensive intraductal component: N/A Lobular neoplasia: Absent Tumor focality: Unifocal Treatment effect: None If present, treatment effect in breast tissue, lymph nodes or both: N/A Extent of tumor: Skin: N/A Nipple: N/A Skeletal muscle: N/A Lymph nodes: # examined: 2 Lymph nodes with metastasis: 1 Macrometastasis: (> 2.0 mm): 1 lymph node with 8 mm tumor deposit Extracapsular extension: Absent Breast prognostic profile: Estrogen receptor: Not repeated, previous study demonstrates 100% positivity ([id number]). Progesterone receptor: Not repeated, previous study demonstrates 100% positivity ([id number]). Her 2 neu: Repeated, previous study demonstrated no amplification (1.33) ([id number]). Ki-67: Not repeated, previous study demonstrated 8% proliferation rate ([id number]). Non-neoplastic breast: Fibrocystic change, sclerosing adenosis, previous biopsy site, and microcalcifications and benign ducts and lobules. TNM: pT1c, pN1a Comments: None. (CR:mw 06/02/11) 2 of 4   RADIOGRAPHIC STUDIES:  No results found.  ASSESSMENT: 57 year old female with  #1 node positive stage I invasive ductal carcinoma of the right breast originally diagnosed in December 2012. Patient underwent a partial mastectomy with sentinel lymph node biopsy on 06/01/2011. The final pathology revealed a 1.7 cm low-grade invasive ductal carcinoma with lymphovascular invasion one of 2 sentinel nodes were positive for metastatic disease.  #2 she went on to receive radiation therapy from March 2013 to April 2013.  #3 her Oncotype DX score was low and she went on on antiestrogen therapy consisting of Arimidex 1 mg daily starting in May 2013. Except for hot flashes she is tolerating it well.  #4 incsion pain   PLAN:   #1 patient will continue Arimidex 1 mg daily.  #2 patient is currently on Effexor 75 mg daily. Tolerating it well.  #3 incisional pain:  Patient is recommended to continue taking ibuprofen as needed and also to use her lymphedema sleeve.  #4 I will see her back in 6 months time for followup.   All questions were answered. The patient knows to call the clinic with any problems, questions or concerns. We can certainly see the patient much sooner if necessary.  The length of time of the face-to-face encounter was 25    minutes. More than 50% of time was spent counseling and coordination of care.   Drue Second, MD Medical/Oncology [facility] [phone] (beeper) [phone] (Office)  02/18/2013, 2:43 PM

## 2013-02-19 ENCOUNTER — Telehealth: Payer: Self-pay | Admitting: Oncology

## 2013-02-19 NOTE — Telephone Encounter (Signed)
x

## 2013-02-19 NOTE — Telephone Encounter (Signed)
S/w the pt and she is aware of her lab and f/u appt in march 2015.

## 2013-03-27 ENCOUNTER — Other Ambulatory Visit: Payer: Self-pay | Admitting: Internal Medicine

## 2013-03-27 DIAGNOSIS — Z853 Personal history of malignant neoplasm of breast: Secondary | ICD-10-CM

## 2013-03-27 DIAGNOSIS — Z9889 Other specified postprocedural states: Secondary | ICD-10-CM

## 2013-04-04 ENCOUNTER — Other Ambulatory Visit: Payer: Self-pay | Admitting: *Deleted

## 2013-04-04 DIAGNOSIS — C50919 Malignant neoplasm of unspecified site of unspecified female breast: Secondary | ICD-10-CM

## 2013-04-04 MED ORDER — VENLAFAXINE HCL ER 75 MG PO CP24: 75.0000 mg | ORAL_CAPSULE | Freq: Every day | ORAL | Status: DC

## 2013-04-12 ENCOUNTER — Ambulatory Visit
Admission: RE | Admit: 2013-04-12 | Discharge: 2013-04-12 | Disposition: A | Discharge status: Home / Self Care | Payer: 59 | Source: Ambulatory Visit | Attending: Internal Medicine | Admitting: Internal Medicine

## 2013-04-12 DIAGNOSIS — Z853 Personal history of malignant neoplasm of breast: Secondary | ICD-10-CM

## 2013-04-12 DIAGNOSIS — Z9889 Other specified postprocedural states: Secondary | ICD-10-CM

## 2013-08-05 ENCOUNTER — Other Ambulatory Visit: Payer: Self-pay | Admitting: *Deleted

## 2013-08-05 DIAGNOSIS — C50919 Malignant neoplasm of unspecified site of unspecified female breast: Secondary | ICD-10-CM

## 2013-08-05 MED ORDER — ANASTROZOLE 1 MG PO TABS: 1.0000 mg | ORAL_TABLET | Freq: Every day | ORAL | Status: DC

## 2013-08-19 ENCOUNTER — Telehealth: Payer: Self-pay | Admitting: Adult Health

## 2013-08-19 NOTE — Telephone Encounter (Signed)
, °

## 2013-08-22 ENCOUNTER — Ambulatory Visit (HOSPITAL_BASED_OUTPATIENT_CLINIC_OR_DEPARTMENT_OTHER): Payer: 59 | Admitting: Adult Health

## 2013-08-22 ENCOUNTER — Encounter: Payer: Self-pay | Admitting: Adult Health

## 2013-08-22 ENCOUNTER — Ambulatory Visit: Payer: 59 | Admitting: Oncology

## 2013-08-22 ENCOUNTER — Other Ambulatory Visit (HOSPITAL_BASED_OUTPATIENT_CLINIC_OR_DEPARTMENT_OTHER): Payer: 59

## 2013-08-22 ENCOUNTER — Other Ambulatory Visit: Payer: 59

## 2013-08-22 VITALS — BP 112/73 | HR 92 | Temp 98.2°F | Resp 18 | Ht 66.0 in | Wt 162.2 lb

## 2013-08-22 DIAGNOSIS — E2839 Other primary ovarian failure: Secondary | ICD-10-CM

## 2013-08-22 DIAGNOSIS — C50411 Malignant neoplasm of upper-outer quadrant of right female breast: Secondary | ICD-10-CM

## 2013-08-22 DIAGNOSIS — C50419 Malignant neoplasm of upper-outer quadrant of unspecified female breast: Secondary | ICD-10-CM

## 2013-08-22 DIAGNOSIS — Z170 Estrogen receptor positive status [ER+]: Secondary | ICD-10-CM

## 2013-08-22 LAB — COMPREHENSIVE METABOLIC PANEL (CC13)
ALBUMIN: 3.9 g/dL (ref 3.5–5.0)
ALT: 15 U/L (ref 0–55)
ANION GAP: 9 meq/L (ref 3–11)
AST: 27 U/L (ref 5–34)
Alkaline Phosphatase: 95 U/L (ref 40–150)
BUN: 4.6 mg/dL — AB (ref 7.0–26.0)
CALCIUM: 9.6 mg/dL (ref 8.4–10.4)
CHLORIDE: 103 meq/L (ref 98–109)
CO2: 28 meq/L (ref 22–29)
CREATININE: 0.8 mg/dL (ref 0.6–1.1)
GLUCOSE: 90 mg/dL (ref 70–140)
POTASSIUM: 3.8 meq/L (ref 3.5–5.1)
Sodium: 140 mEq/L (ref 136–145)
Total Bilirubin: 0.25 mg/dL (ref 0.20–1.20)
Total Protein: 7.5 g/dL (ref 6.4–8.3)

## 2013-08-22 LAB — CBC WITH DIFFERENTIAL/PLATELET
BASO%: 1.5 % (ref 0.0–2.0)
BASOS ABS: 0.1 10*3/uL (ref 0.0–0.1)
EOS ABS: 0.1 10*3/uL (ref 0.0–0.5)
EOS%: 2.1 % (ref 0.0–7.0)
HEMATOCRIT: 36.7 % (ref 34.8–46.6)
HEMOGLOBIN: 12.1 g/dL (ref 11.6–15.9)
LYMPH#: 1.5 10*3/uL (ref 0.9–3.3)
LYMPH%: 43.1 % (ref 14.0–49.7)
MCH: 30.2 pg (ref 25.1–34.0)
MCHC: 32.9 g/dL (ref 31.5–36.0)
MCV: 91.6 fL (ref 79.5–101.0)
MONO#: 0.4 10*3/uL (ref 0.1–0.9)
MONO%: 11.2 % (ref 0.0–14.0)
NEUT%: 42.1 % (ref 38.4–76.8)
NEUTROS ABS: 1.4 10*3/uL — AB (ref 1.5–6.5)
Platelets: 300 10*3/uL (ref 145–400)
RBC: 4.01 10*6/uL (ref 3.70–5.45)
RDW: 12.6 % (ref 11.2–14.5)
WBC: 3.4 10*3/uL — ABNORMAL LOW (ref 3.9–10.3)

## 2013-08-22 NOTE — Progress Notes (Signed)
Hematology and Oncology Follow Up Visit  Casey Wall [id number] 1955-12-27 58 y.o. 08/23/2013 4:02 PM     Principle Diagnosis:Casey Wall 58 y.o. female with ER postitive, PR positive, HER-2/neu negative invasive ductal carcinoma of the right breast.     Prior Therapy:#1 patient originally presented for a screening mammogram in [facility] that showed area of asymmetry on spot compressions within the axillary region of the right breast. Ultrasound showed a nonspecific mass measuring 1.2 cm. She had further evaluation at the breast Center and she was noted to have a 1.4 x 1.3 x 1.2 cm irregular hypoechoic mass within the right breast at the 10:00 position 15 cm from the nipple corresponding to the palpable abnormality.   #2 on 05/04/2011 patient had a biopsy performed that revealed invasive carcinoma with associated calcifications. The tumor was strongly ER positive PR positive with a proliferation marker Ki-67 only 8% and HER-2/neu negative.   #3 patient had breast MRIs performed on 05/09/2011 that showed solitary enhancing mass within the upper outer quadrant of the right breast with multiple subcentimeter lesions within the liver suspected to be cysts.   #4 patient went on to have a right partial mastectomy with sentinel lymph node biopsy on 06/01/2011. The final pathology revealed a 1.7 cm low-grade invasive ductal carcinoma with lymphovascular invasion one of 2 sentinel lymph nodes were positive for metastatic disease with the deposit measuring 0.8 cm no extracapsular extension.   #5 patient had Oncotype DX testing performed her breast cancer recurrence score was 14 giving her a 5 year risk of distant recurrence at 9% and therefore she was in the low risk category. She was randomized to antiestrogen therapy alone.  #7 patient received radiation therapy to the breast from March 2013 - 09/21/2011.   #8 she was then begun on Arimidex 1 mg daily starting May 2013   Current therapy:  Arimidex daily  Interim History: Casey Wall 58 y.o. female here for f/u of her right breast ER/PR positive invasive ductal carcinoma of the right breast. She is doing relatively well.  She is taking Arimidex daily and tolerates it moderately well.  She does experience hot flashes and occasional vaginal dryness.  Her dryness doesnot interfere ability to have sexual intercourse comfortably and she uses lubricant when needed for vaginal dryness and it works well.  She denies fevers, chills night sweats, new pain, unitnentional weight loss or any other questions concerns.  We updated her health maintenance below.    Medications:  Current Outpatient Prescriptions  Medication Sig Dispense Refill  . anastrozole (ARIMIDEX) 1 MG tablet Take 1 tablet (1 mg total) by mouth daily.  90 tablet  0  . cetirizine (ZYRTEC) 10 MG tablet Take 10 mg by mouth daily.      . Cholecalciferol (VITAMIN D PO) Take 1,000 mg by mouth daily.       . DENTAGEL 1.1 % GEL dental gel       . fluticasone (FLONASE) 50 MCG/ACT nasal spray Place 2 sprays into the nose as needed.       . Melatonin 1 MG CAPS Take 1 capsule by mouth at bedtime as needed. Pt. Not sure of dose      . pantoprazole (PROTONIX) 40 MG tablet       . Probiotic Product (SOLUBLE FIBER/PROBIOTICS PO) Take 1 capsule by mouth daily.       Marland Kitchen tretinoin microspheres (RETIN-A MICRO) 0.04 % gel Apply topically at bedtime.       Marland Kitchen  UNABLE TO FIND 1 capsule 2 (two) times daily. Cleanse More OTC      . venlafaxine XR (EFFEXOR-XR) 75 MG 24 hr capsule Take 1 capsule (75 mg total) by mouth daily.  90 capsule  1  . vitamin E (VITAMIN E) 400 UNIT capsule Take 400 Units by mouth 3 (three) times daily.       No current facility-administered medications for this visit.     Allergies: No Known Allergies  Medical History: Past Medical History  Diagnosis Date  . Constipation   . Breast cancer 04/2011    right  . Seasonal allergies   . History of fibrocystic disease of  breast   . Anemia   . Stomach ulcer 2012  . GERD (gastroesophageal reflux disease) 2012  . Cancer of upper-outer quadrant of female breast 05/10/2011  . Status post radiation therapy 08/08/11 - 09/21/11    Right Breast: 50.4 Gy/28 Fractions: Boost 10 Gy/ 5 Fractions  . Hot flashes related to aromatase inhibitor therapy     Arimidex    Surgical History:  Past Surgical History  Procedure Laterality Date  . Total abdominal hysterectomy  1997  . Cesarean section  1980  . Cervical discectomy  2007  . Breast lumpectomy  06/01/11    right  . Breast cyst aspirated    . Herniated disc surgery  2007    neck     Review of Systems: A 10 point review of systems was conducted and is otherwise negative except for what is noted above.    Health Maintenance  Mammogram:  04/12/2013 Colonoscopy: 2014, 10 year f/u recommended Bone Density Scan: 06/28/2011, normal Pap Smear: Due this year Eye Exam: not recently Vitamin D Level: 01/10/13 Lipid Panel: checked by PCP   Physical Exam: Blood pressure 112/73, pulse 92, temperature 98.2 F (36.8 C), temperature source Oral, resp. rate 18, height $RemoveBe'5\' 6"'qgEHQRgcm$  (1.676 m), weight 162 lb 3.2 oz (73.573 kg). GENERAL: Patient is a well appearing female in no acute distress HEENT:  Sclerae anicteric.  Oropharynx clear and moist. No ulcerations or evidence of oropharyngeal candidiasis. Neck is supple.  NODES:  No cervical, supraclavicular, or axillary lymphadenopathy palpated.  BREAST EXAM:  Right breast s/p lumpectomy, no nodularity masses, or sign of recurrence.  Left breast without mass lesions or skin changes.  Benign breast exam.   LUNGS:  Clear to auscultation bilaterally.  No wheezes or rhonchi. HEART:  Regular rate and rhythm. No murmur appreciated. ABDOMEN:  Soft, nontender.  Positive, normoactive bowel sounds. No organomegaly palpated. MSK:  No focal spinal tenderness to palpation. Full range of motion bilaterally in the upper extremities. EXTREMITIES:  No  peripheral edema.   SKIN:  Clear with no obvious rashes or skin changes. No nail dyscrasia. NEURO:  Nonfocal. Well oriented.  Appropriate affect. ECOG PERFORMANC2E STATUS: 1 - Symptomatic but completely ambulatory   Lab Results: Lab Results  Component Value Date   WBC 3.4* 08/22/2013   HGB 12.1 08/22/2013   HCT 36.7 08/22/2013   MCV 91.6 08/22/2013   PLT 300 08/22/2013     Chemistry      Component Value Date/Time   NA 140 08/22/2013 1130   NA 139 01/20/2012 1136   K 3.8 08/22/2013 1130   K 4.1 01/20/2012 1136   CL 103 01/20/2012 1136   CO2 28 08/22/2013 1130   CO2 30 01/20/2012 1136   BUN 4.6* 08/22/2013 1130   BUN 9 01/20/2012 1136   CREATININE 0.8 08/22/2013 1130  CREATININE 0.72 01/20/2012 1136      Component Value Date/Time   CALCIUM 9.6 08/22/2013 1130   CALCIUM 9.7 01/20/2012 1136   ALKPHOS 95 08/22/2013 1130   ALKPHOS 72 01/20/2012 1136   AST 27 08/22/2013 1130   AST 19 01/20/2012 1136   ALT 15 08/22/2013 1130   ALT 11 01/20/2012 1136   BILITOT 0.25 08/22/2013 1130   BILITOT 0.2* 01/20/2012 1136     Assessment and Plan: Casey Wall 58 y.o. female with  1.  ER positive, PR positive, HER-2/neu negative invasive ductal carcinoma of the right breast.  She is s/p surgery, and radiation therapy, and began adjuvant Arimidex in May, 2013.  She is taking arimidex daily and tolerating it moderately well.   She will continue this.  I counseled her today on survivorship, healthy diet and exercise along with monthly self breast exams.    2.  Due to her vaginal dryness I gave her the sexuality with a diagnosis of breast cancer.  She was appreciative.    The patient will return in 6 months for labs and evaluation.  She knows to call us if she has any questions or concerns and we can certainly see her sooner if indicated.    I spent 25 minutes counseling the patient face to face.  The total time spent in the appointment was 30 minutes.  Minette Headland, [place] [phone] 08/23/2013 4:02 PM

## 2013-08-22 NOTE — Patient Instructions (Signed)
You are doing well.  You have no sign of recurrence.  Continue Arimidex.  I ordered a bone density exam today.  We recommend you stay up to date on your mammogram, colonoscopy, self breast exam and engage in healthy diet and exercise daily.    We will see you back in 6 months.  Please call us if you have any questions or concerns.    Bone Densitometry Bone densitometry is a special X-ray that measures your bone density and can be used to help predict your risk of bone fractures. This test is used to determine bone mineral content and density to diagnose osteoporosis. Osteoporosis is the loss of bone that may cause the bone to become weak. Osteoporosis commonly occurs in women entering menopause. However, it may be found in men and in people with other diseases. PREPARATION FOR TEST No preparation necessary. WHO SHOULD BE TESTED?  All women older than 59.  Postmenopausal women (50 to 55) with risk factors for osteoporosis.  People with a previous fracture caused by normal activities.  People with a small body frame (less than 127 poundsor a body mass index [BMI] of less than 21).  People who have a parent with a hip fracture or history of osteoporosis.  People who smoke.  People who have rheumatoid arthritis.  Anyone who engages in excessive alcohol use (more than 3 drinks most days).  Women who experience early menopause. WHEN SHOULD YOU BE RETESTED? Current guidelines suggest that you should wait at least 2 years before doing a bone density test again if your first test was normal.Recent studies indicated that women with normal bone density may be able to wait a few years before needing to repeat a bone density test. You should discuss this with your caregiver.  NORMAL FINDINGS   Normal: less than standard deviation below normal (greater than -1).  Osteopenia: 1 to 2.5 standard deviations below normal (-1 to -2.5).  Osteoporosis: greater than 2.5 standard deviations below  normal (less than -2.5). Test results are reported as a "T score" and a "Z score."The T score is a number that compares your bone density with the bone density of healthy, young women.The Z score is a number that compares your bone density with the scores of women who are the same age, gender, and race.  Ranges for normal findings may vary among different laboratories and hospitals. You should always check with your doctor after having lab work or other tests done to discuss the meaning of your test results and whether your values are considered within normal limits. MEANING OF TEST  Your caregiver will go over the test results with you and discuss the importance and meaning of your results, as well as treatment options and the need for additional tests if necessary. OBTAINING THE TEST RESULTS It is your responsibility to obtain your test results. Ask the lab or department performing the test when and how you will get your results. Document Released: 06/07/2004 Document Revised: 08/08/2011 Document Reviewed: 06/30/2010 [facility] Patient Information 2014 [place].

## 2013-09-02 ENCOUNTER — Ambulatory Visit
Admission: RE | Admit: 2013-09-02 | Discharge: 2013-09-02 | Disposition: A | Discharge status: Home / Self Care | Payer: 59 | Source: Ambulatory Visit | Attending: Adult Health | Admitting: Adult Health

## 2013-09-02 DIAGNOSIS — E2839 Other primary ovarian failure: Secondary | ICD-10-CM

## 2013-09-09 NOTE — Progress Notes (Signed)
Rcvd report from [facility] d[facility] 09/02/13.  Provided to [facility].

## 2013-09-19 ENCOUNTER — Telehealth: Payer: Self-pay | Admitting: *Deleted

## 2013-09-19 NOTE — Telephone Encounter (Signed)
Per [name], NP, I informed patient that she now has Osteopenia. [name] recommends Calcium 1200 mg po daily, Vitamin D, and weight bearing exercises. Patient stated, "I'm already taking Vitamin D 1000 mg daily. I am walking and doing yoga." Patient stated," I will get some Calcium this weekend and start taking it." Patient verbalized understanding.

## 2013-11-01 ENCOUNTER — Other Ambulatory Visit: Payer: Self-pay | Admitting: Oncology

## 2014-02-14 ENCOUNTER — Telehealth: Payer: Self-pay | Admitting: Adult Health

## 2014-02-14 NOTE — Telephone Encounter (Signed)
, °

## 2014-02-18 ENCOUNTER — Telehealth: Payer: Self-pay | Admitting: *Deleted

## 2014-02-18 DIAGNOSIS — C50919 Malignant neoplasm of unspecified site of unspecified female breast: Secondary | ICD-10-CM

## 2014-02-18 MED ORDER — ANASTROZOLE 1 MG PO TABS: 1.0000 mg | ORAL_TABLET | Freq: Every day | ORAL | Status: DC

## 2014-02-18 NOTE — Telephone Encounter (Signed)
Returned pt's phone call concerning refill for Anastrozole. Sent refill for Anastrozole 1 mg tablet Disp-90, Refill-2 to her pharmacy and reminded her of appt with Charlestine Massed, NP on 02/21/2014. Message to be forwarded to Charlestine Massed, NP

## 2014-02-21 ENCOUNTER — Ambulatory Visit (HOSPITAL_BASED_OUTPATIENT_CLINIC_OR_DEPARTMENT_OTHER): Payer: 59 | Admitting: Adult Health

## 2014-02-21 ENCOUNTER — Telehealth: Payer: Self-pay | Admitting: Adult Health

## 2014-02-21 ENCOUNTER — Other Ambulatory Visit (HOSPITAL_BASED_OUTPATIENT_CLINIC_OR_DEPARTMENT_OTHER): Payer: 59

## 2014-02-21 ENCOUNTER — Encounter: Payer: Self-pay | Admitting: Adult Health

## 2014-02-21 VITALS — BP 111/69 | HR 75 | Temp 98.2°F | Resp 18 | Ht 66.0 in | Wt 162.7 lb

## 2014-02-21 DIAGNOSIS — E559 Vitamin D deficiency, unspecified: Secondary | ICD-10-CM

## 2014-02-21 DIAGNOSIS — C50411 Malignant neoplasm of upper-outer quadrant of right female breast: Secondary | ICD-10-CM

## 2014-02-21 DIAGNOSIS — C50419 Malignant neoplasm of upper-outer quadrant of unspecified female breast: Secondary | ICD-10-CM

## 2014-02-21 DIAGNOSIS — Z170 Estrogen receptor positive status [ER+]: Secondary | ICD-10-CM

## 2014-02-21 DIAGNOSIS — M949 Disorder of cartilage, unspecified: Secondary | ICD-10-CM

## 2014-02-21 DIAGNOSIS — M899 Disorder of bone, unspecified: Secondary | ICD-10-CM

## 2014-02-21 LAB — CBC WITH DIFFERENTIAL/PLATELET
BASO%: 1.2 % (ref 0.0–2.0)
BASOS ABS: 0 10*3/uL (ref 0.0–0.1)
EOS ABS: 0 10*3/uL (ref 0.0–0.5)
EOS%: 1 % (ref 0.0–7.0)
HCT: 36.8 % (ref 34.8–46.6)
HEMOGLOBIN: 11.9 g/dL (ref 11.6–15.9)
LYMPH#: 1.3 10*3/uL (ref 0.9–3.3)
LYMPH%: 34.9 % (ref 14.0–49.7)
MCH: 29.7 pg (ref 25.1–34.0)
MCHC: 32.2 g/dL (ref 31.5–36.0)
MCV: 92.3 fL (ref 79.5–101.0)
MONO#: 0.5 10*3/uL (ref 0.1–0.9)
MONO%: 11.8 % (ref 0.0–14.0)
NEUT%: 51.1 % (ref 38.4–76.8)
NEUTROS ABS: 2 10*3/uL (ref 1.5–6.5)
Platelets: 320 10*3/uL (ref 145–400)
RBC: 3.99 10*6/uL (ref 3.70–5.45)
RDW: 12.6 % (ref 11.2–14.5)
WBC: 3.9 10*3/uL (ref 3.9–10.3)

## 2014-02-21 LAB — COMPREHENSIVE METABOLIC PANEL (CC13)
ALBUMIN: 3.9 g/dL (ref 3.5–5.0)
ALK PHOS: 88 U/L (ref 40–150)
ALT: 8 U/L (ref 0–55)
AST: 24 U/L (ref 5–34)
Anion Gap: 8 mEq/L (ref 3–11)
BUN: 7.4 mg/dL (ref 7.0–26.0)
CALCIUM: 9.9 mg/dL (ref 8.4–10.4)
CHLORIDE: 105 meq/L (ref 98–109)
CO2: 29 mEq/L (ref 22–29)
Creatinine: 0.8 mg/dL (ref 0.6–1.1)
GLUCOSE: 88 mg/dL (ref 70–140)
POTASSIUM: 3.9 meq/L (ref 3.5–5.1)
SODIUM: 141 meq/L (ref 136–145)
TOTAL PROTEIN: 7.5 g/dL (ref 6.4–8.3)
Total Bilirubin: 0.41 mg/dL (ref 0.20–1.20)

## 2014-02-21 NOTE — Telephone Encounter (Signed)
, °

## 2014-02-21 NOTE — Patient Instructions (Signed)
You are doing well.  You have no sign of recurrence.  Continue taking Arimidex daily.  I recommend healthy diet, exercise (aerobic and weight bearing), self breast exams.  Take a calcium and vitamin d supplement daily.    Use replens three times per week.    Breast Self-Awareness Practicing breast self-awareness may pick up problems early, prevent significant medical complications, and possibly save your life. By practicing breast self-awareness, you can become familiar with how your breasts look and feel and if your breasts are changing. This allows you to notice changes early. It can also offer you some reassurance that your breast health is good. One way to learn what is normal for your breasts and whether your breasts are changing is to do a breast self-exam. If you find a lump or something that was not present in the past, it is best to contact your caregiver right away. Other findings that should be evaluated by your caregiver include nipple discharge, especially if it is bloody; skin changes or reddening; areas where the skin seems to be pulled in (retracted); or new lumps and bumps. Breast pain is seldom associated with cancer (malignancy), but should also be evaluated by a caregiver. HOW TO PERFORM A BREAST SELF-EXAM The best time to examine your breasts is 5-7 days after your menstrual period is over. During menstruation, the breasts are lumpier, and it may be more difficult to pick up changes. If you do not menstruate, have reached menopause, or had your uterus removed (hysterectomy), you should examine your breasts at regular intervals, such as monthly. If you are breastfeeding, examine your breasts after a feeding or after using a breast pump. Breast implants do not decrease the risk for lumps or tumors, so continue to perform breast self-exams as recommended. Talk to your caregiver about how to determine the difference between the implant and breast tissue. Also, talk about the amount of  pressure you should use during the exam. Over time, you will become more familiar with the variations of your breasts and more comfortable with the exam. A breast self-exam requires you to remove all your clothes above the waist. 1. Look at your breasts and nipples. Stand in front of a mirror in a room with good lighting. With your hands on your hips, push your hands firmly downward. Look for a difference in shape, contour, and size from one breast to the other (asymmetry). Asymmetry includes puckers, dips, or bumps. Also, look for skin changes, such as reddened or scaly areas on the breasts. Look for nipple changes, such as discharge, dimpling, repositioning, or redness. 2. Carefully feel your breasts. This is best done either in the shower or tub while using soapy water or when flat on your back. Place the arm (on the side of the breast you are examining) above your head. Use the pads (not the fingertips) of your three middle fingers on your opposite hand to feel your breasts. Start in the underarm area and use  inch (2 cm) overlapping circles to feel your breast. Use 3 different levels of pressure (light, medium, and firm pressure) at each circle before moving to the next circle. The light pressure is needed to feel the tissue closest to the skin. The medium pressure will help to feel breast tissue a little deeper, while the firm pressure is needed to feel the tissue close to the ribs. Continue the overlapping circles, moving downward over the breast until you feel your ribs below your breast. Then, move one finger-width  towards the center of the body. Continue to use the  inch (2 cm) overlapping circles to feel your breast as you move slowly up toward the collar bone (clavicle) near the base of the neck. Continue the up and down exam using all 3 pressures until you reach the middle of the chest. Do this with each breast, carefully feeling for lumps or changes. 3.  Keep a written record with breast changes  or normal findings for each breast. By writing this information down, you do not need to depend only on memory for size, tenderness, or location. Write down where you are in your menstrual cycle, if you are still menstruating. Breast tissue can have some lumps or thick tissue. However, see your caregiver if you find anything that concerns you.  SEEK MEDICAL CARE IF:  You see a change in shape, contour, or size of your breasts or nipples.   You see skin changes, such as reddened or scaly areas on the breasts or nipples.   You have an unusual discharge from your nipples.   You feel a new lump or unusually thick areas.  Document Released: 05/16/2005 Document Revised: 05/02/2012 Document Reviewed: 08/31/2011 [facility] Patient Information 2015 [place], [place]. This information is not intended to replace advice given to you by your health care provider. Make sure you discuss any questions you have with your health care provider.

## 2014-02-21 NOTE — Progress Notes (Signed)
Hematology and Oncology Follow Up Visit  Casey Wall [id number] 1955/07/14 58 y.o. 02/21/2014 11:30 AM     Principle Diagnosis:Casey Wall 58 y.o. female with ER postitive, PR positive, HER-2/neu negative invasive ductal carcinoma of the right breast.     Prior Therapy:#1 patient originally presented for a screening mammogram in [facility] that showed area of asymmetry on spot compressions within the axillary region of the right breast. Ultrasound showed a nonspecific mass measuring 1.2 cm. She had further evaluation at the breast Center and she was noted to have a 1.4 x 1.3 x 1.2 cm irregular hypoechoic mass within the right breast at the 10:00 position 15 cm from the nipple corresponding to the palpable abnormality.   #2 on 05/04/2011 patient had a biopsy performed that revealed invasive carcinoma with associated calcifications. The tumor was strongly ER positive PR positive with a proliferation marker Ki-67 only 8% and HER-2/neu negative.   #3 patient had breast MRIs performed on 05/09/2011 that showed solitary enhancing mass within the upper outer quadrant of the right breast with multiple subcentimeter lesions within the liver suspected to be cysts.   #4 patient went on to have a right partial mastectomy with sentinel lymph node biopsy on 06/01/2011. The final pathology revealed a 1.7 cm low-grade invasive ductal carcinoma with lymphovascular invasion one of 2 sentinel lymph nodes were positive for metastatic disease with the deposit measuring 0.8 cm no extracapsular extension.   #5 patient had Oncotype DX testing performed her breast cancer recurrence score was 14 giving her a 5 year risk of distant recurrence at 9% and therefore she was in the low risk category. She was randomized to antiestrogen therapy alone.  #7 patient received radiation therapy to the breast from March 2013 - 09/21/2011.   #8 she was then begun on Arimidex 1 mg daily starting May 2013   Current therapy:  Arimidex daily  Interim History: Casey Wall 58 y.o. female here for f/u of her right breast ER/PR positive invasive ductal carcinoma of the right breast. She is doing relatively well.  She is taking Arimidex daily and tolerates it moderately well.  She does have increasing vaginal dryness.  This is more severe from her last appointment.  Her vaginal area has become more uncomfortable, and intercourse has become painful.  She also experiences hot flashes and is taking Vitamin E and Effexor XR for this and has noticed minimal change.  She otherwise denies new pain, headaches, weakness, numbness, bowel/bladder changes, fevers, chills, unintentional weight loss, shortness of breath, chest pain, cough, or any further concerns.  Medications:  Current Outpatient Prescriptions  Medication Sig Dispense Refill  . anastrozole (ARIMIDEX) 1 MG tablet Take 1 tablet (1 mg total) by mouth daily.  90 tablet  2  . cetirizine (ZYRTEC) 10 MG tablet Take 10 mg by mouth daily.      . Cholecalciferol (VITAMIN D PO) Take 2,000 mg by mouth daily.       . fluticasone (FLONASE) 50 MCG/ACT nasal spray Place 2 sprays into the nose as needed.       . Melatonin 1 MG CAPS Take 1 capsule by mouth at bedtime as needed. Pt. Not sure of dose      . pantoprazole (PROTONIX) 40 MG tablet       . Probiotic Product (SOLUBLE FIBER/PROBIOTICS PO) Take 1 capsule by mouth daily.       Marland Kitchen tretinoin microspheres (RETIN-A MICRO) 0.04 % gel Apply topically at bedtime.       Marland Kitchen  UNABLE TO FIND 1 capsule 2 (two) times daily. Cleanse More OTC      . venlafaxine XR (EFFEXOR-XR) 75 MG 24 hr capsule TAKE 1 CAPSULE (75 MG TOTAL) BY MOUTH DAILY.  90 capsule  1  . vitamin E (VITAMIN E) 400 UNIT capsule Take 400 Units by mouth 3 (three) times daily.      . DENTAGEL 1.1 % GEL dental gel        No current facility-administered medications for this visit.     Allergies: No Known Allergies  Medical History: Past Medical History  Diagnosis Date  .  Constipation   . Breast cancer 04/2011    right  . Seasonal allergies   . History of fibrocystic disease of breast   . Anemia   . Stomach ulcer 2012  . GERD (gastroesophageal reflux disease) 2012  . Cancer of upper-outer quadrant of female breast 05/10/2011  . Status post radiation therapy 08/08/11 - 09/21/11    Right Breast: 50.4 Gy/28 Fractions: Boost 10 Gy/ 5 Fractions  . Hot flashes related to aromatase inhibitor therapy     Arimidex    Surgical History:  Past Surgical History  Procedure Laterality Date  . Total abdominal hysterectomy  1997  . Cesarean section  1980  . Cervical discectomy  2007  . Breast lumpectomy  06/01/11    right  . Breast cyst aspirated    . Herniated disc surgery  2007    neck     Review of Systems: A 10 point review of systems was conducted and is otherwise negative except for what is noted above.    Health Maintenance  Mammogram:  04/12/2013 Colonoscopy: 2014, 10 year f/u recommended Bone Density Scan: 08/2013, osteopenia Pap Smear: Due this year Eye Exam: not recently Vitamin D Level: 01/10/13 Lipid Panel: checked by PCP   Physical Exam: Blood pressure 111/69, pulse 75, temperature 98.2 F (36.8 C), temperature source Oral, resp. rate 18, height 5' 6" (1.676 m), weight 162 lb 11.2 oz (73.8 kg). GENERAL: Patient is a well appearing female in no acute distress HEENT:  Sclerae anicteric.  Oropharynx clear and moist. No ulcerations or evidence of oropharyngeal candidiasis. Neck is supple.  NODES:  No cervical, supraclavicular, or axillary lymphadenopathy palpated.  BREAST EXAM:  Right breast s/p lumpectomy, no nodularity masses, or sign of recurrence.  Left breast without mass lesions or skin changes.  Benign breast exam.   LUNGS:  Clear to auscultation bilaterally.  No wheezes or rhonchi. HEART:  Regular rate and rhythm. No murmur appreciated. ABDOMEN:  Soft, nontender.  Positive, normoactive bowel sounds. No organomegaly palpated. MSK:  No  focal spinal tenderness to palpation. Full range of motion bilaterally in the upper extremities. EXTREMITIES:  No peripheral edema.   SKIN:  Clear with no obvious rashes or skin changes. No nail dyscrasia. NEURO:  Nonfocal. Well oriented.  Appropriate affect. ECOG PERFORMANC2E STATUS: 1 - Symptomatic but completely ambulatory   Lab Results: Lab Results  Component Value Date   WBC 3.9 02/21/2014   HGB 11.9 02/21/2014   HCT 36.8 02/21/2014   MCV 92.3 02/21/2014   PLT 320 02/21/2014     Chemistry      Component Value Date/Time   NA 141 02/21/2014 1057   NA 139 01/20/2012 1136   K 3.9 02/21/2014 1057   K 4.1 01/20/2012 1136   CL 103 01/20/2012 1136   CO2 29 02/21/2014 1057   CO2 30 01/20/2012 1136   BUN 7.4 02/21/2014 1057     BUN 9 01/20/2012 1136   CREATININE 0.8 02/21/2014 1057   CREATININE 0.72 01/20/2012 1136      Component Value Date/Time   CALCIUM 9.9 02/21/2014 1057   CALCIUM 9.7 01/20/2012 1136   ALKPHOS 88 02/21/2014 1057   ALKPHOS 72 01/20/2012 1136   AST 24 02/21/2014 1057   AST 19 01/20/2012 1136   ALT 8 02/21/2014 1057   ALT 11 01/20/2012 1136   BILITOT 0.41 02/21/2014 1057   BILITOT 0.2* 01/20/2012 1136     Assessment and Plan: Casey Wall 58 y.o. female with  1.  ER positive, PR positive, HER-2/neu negative invasive ductal carcinoma of the right breast.  She is s/p surgery, and radiation therapy, and began adjuvant Arimidex in May, 2013.  She is taking arimidex daily and tolerating it moderately well.   She will continue this.  I counseled her today on survivorship, healthy diet and exercise along with monthly self breast exams.    2.  Osteopenia:  This was evident on the most recent bone density.  I recommended calcium and vitamin d intake along with weight bearing exercises.  Her vitamin d level is pending today.  She will undergo repeat bone density in 2 years.    3. Vaginal dryness.  I recommended Replens and coconut oil for her dryness.  We also discussed vaginal dilation  for her atrophy and dyspareunia.    The patient will return in 6 months for labs and evaluation.  She knows to call us if she has any questions or concerns and we can certainly see her sooner if indicated.    I spent 25 minutes counseling the patient face to face.  The total time spent in the appointment was 30 minutes.  Minette Headland, [place] [phone] 02/21/2014 11:30 AM

## 2014-02-22 LAB — VITAMIN D 25 HYDROXY (VIT D DEFICIENCY, FRACTURES): VIT D 25 HYDROXY: 31 ng/mL (ref 30–89)

## 2014-03-20 ENCOUNTER — Other Ambulatory Visit: Payer: Self-pay | Admitting: Internal Medicine

## 2014-03-20 DIAGNOSIS — Z853 Personal history of malignant neoplasm of breast: Secondary | ICD-10-CM

## 2014-03-31 ENCOUNTER — Telehealth: Payer: Self-pay | Admitting: *Deleted

## 2014-03-31 NOTE — Telephone Encounter (Signed)
Patient called c/o joint pain. Patient states she has had joint pain since the beginning of the year but just thought it was her mattress. States that pain has worsened in the past week, in her hands and wrists. Advised patient that this is a common side effect of the Arimidex. Advised patient to try exercise, Aleve and drinking lots of water to help with pain. Also told patient that she can call us back if symptoms worsen. Patient verbalized understanding.

## 2014-04-03 ENCOUNTER — Other Ambulatory Visit: Payer: Self-pay | Admitting: *Deleted

## 2014-04-03 ENCOUNTER — Encounter: Payer: Self-pay | Admitting: Adult Health

## 2014-04-03 NOTE — Progress Notes (Signed)
Charlestine Massed notified of this request and that this nurse has called patient awaiting return call with symptoms.  Order received and read back from Ms. Cornetto to schedule patient with Dr. Lindi Adie or Dr. Burr Medico next available.  Will schedule with [facility] if needed.

## 2014-04-04 ENCOUNTER — Telehealth: Payer: Self-pay | Admitting: Adult Health

## 2014-04-04 NOTE — Telephone Encounter (Signed)
lvm fo rpt regarding to 1st available...done

## 2014-04-11 ENCOUNTER — Ambulatory Visit (HOSPITAL_BASED_OUTPATIENT_CLINIC_OR_DEPARTMENT_OTHER): Payer: 59 | Admitting: Hematology and Oncology

## 2014-04-11 DIAGNOSIS — C50419 Malignant neoplasm of upper-outer quadrant of unspecified female breast: Secondary | ICD-10-CM

## 2014-04-11 DIAGNOSIS — Z853 Personal history of malignant neoplasm of breast: Secondary | ICD-10-CM

## 2014-04-11 MED ORDER — EXEMESTANE 25 MG PO TABS: 25.0000 mg | ORAL_TABLET | Freq: Every day | ORAL | Status: DC

## 2014-04-11 NOTE — Assessment & Plan Note (Signed)
The right breast invasive ductal carcinoma ER/PR positive HER-2 negative status post lumpectomy and radiation and has been on Arimidex since May 2013  Aromatase inhibitor toxicities: Vaginal dryness and dyspareunia as well as diffuse muscle aches and pains. I discussed different options with her including using Claritin-D for muscle aches and pains as well as switching her to a different aromatase inhibitor. After lengthy discussion, we elected to switch her from Arimidex to exemestane. I would like to see her back in one month to assess tolerance to exemestane.  Vaginal dryness: Patient is using Replens and coconut oil Surveillance: Patient has a mammogram set up for Monday. I will see her back in a month to do a breast exam and followup.

## 2014-04-11 NOTE — Progress Notes (Signed)
Patient Care Team: Wenda Low, MD as PCP - General (Internal Medicine) Haywood Lasso, MD (General Surgery) Eston Esters, MD (Hematology and Oncology) Rexene Edison, MD (Radiation Oncology)  DIAGNOSIS: Cancer of upper-outer quadrant of female breast   Staging form: Breast, AJCC 7th Edition     Clinical: Stage IA (T1, N0, cM0) - Unsigned       Prognostic indicators: ER 100%  PR 100%  Her 2 -  Ki67=8%      Pathologic: Stage IIA (T1, N1a, cM0) - Signed by Haywood Lasso, MD on 06/03/2011       Prognostic indicators: ER 100%  PR 100%  Her 2 -  Ki67=8%  Principle Diagnosis:Casey Wall 58 y.o. female with ER postitive, PR positive, HER-2/neu negative invasive ductal carcinoma of the right breast.    Prior Therapy:#1 patient originally presented for a screening mammogram in [facility] that showed area of asymmetry on spot compressions within the axillary region of the right breast. Ultrasound showed a nonspecific mass measuring 1.2 cm. She had further evaluation at the breast Center and she was noted to have a 1.4 x 1.3 x 1.2 cm irregular hypoechoic mass within the right breast at the 10:00 position 15 cm from the nipple corresponding to the palpable abnormality.   #2 on 05/04/2011 patient had a biopsy performed that revealed invasive carcinoma with associated calcifications. The tumor was strongly ER positive PR positive with a proliferation marker Ki-67 only 8% and HER-2/neu negative.   #3 patient had breast MRIs performed on 05/09/2011 that showed solitary enhancing mass within the upper outer quadrant of the right breast with multiple subcentimeter lesions within the liver suspected to be cysts.   #4 patient went on to have a right partial mastectomy with sentinel lymph node biopsy on 06/01/2011. The final pathology revealed a 1.7 cm low-grade invasive ductal carcinoma with lymphovascular invasion one of 2 sentinel lymph nodes were positive for metastatic disease with the  deposit measuring 0.8 cm no extracapsular extension.   #5 patient had Oncotype DX testing performed her breast cancer recurrence score was 14 giving her a 5 year risk of distant recurrence at 9% and therefore she was in the low risk category. She was randomized to antiestrogen therapy alone.  #7 patient received radiation therapy to the breast from March 2013 - 09/21/2011.   #8 she was then begun on Arimidex 1 mg daily starting May 2013 until 04/28/2014, switched to exemestane 25 mg  CHIEF COMPLIANT: muscle aches and pains, vaginal dryness  INTERVAL HISTORY: Casey Wall is a 58 year old American lady with above-mentioned history of breast cancer that was treated with lumpectomy followed by radiation of antiestrogen therapy with Arimidex for the past 2 and half years. She has been experiencing no side effects to Arimidex. These include muscle aches and pains as well as vaginal dryness and dyspareunia.  REVIEW OF SYSTEMS:   Constitutional: Denies fevers, chills or abnormal weight loss Eyes: Denies blurriness of vision Ears, nose, mouth, throat, and face: Denies mucositis or sore throat Respiratory: Denies cough, dyspnea or wheezes Cardiovascular: Denies palpitation, chest discomfort or lower extremity swelling Gastrointestinal:  Denies nausea, heartburn or change in bowel habits Skin: Denies abnormal skin rashes Lymphatics: Denies new lymphadenopathy or easy bruising Neurological:Denies numbness, tingling or new weaknesses Behavioral/Psych: Mood is stable, no new changes  Breast:  denies any pain or lumps or nodules in either breasts All other systems were reviewed with the patient and are negative.  I have reviewed the  past medical history, past surgical history, social history and family history with the patient and they are unchanged from previous note.  ALLERGIES:  has No Known Allergies.  MEDICATIONS:  Current Outpatient Prescriptions  Medication Sig Dispense Refill  .  cetirizine (ZYRTEC) 10 MG tablet Take 10 mg by mouth daily.    . Cholecalciferol (VITAMIN D PO) Take 2,000 mg by mouth daily.     . DENTAGEL 1.1 % GEL dental gel     . fluticasone (FLONASE) 50 MCG/ACT nasal spray Place 2 sprays into the nose as needed.     . Melatonin 1 MG CAPS Take 1 capsule by mouth at bedtime as needed. Pt. Not sure of dose    . pantoprazole (PROTONIX) 40 MG tablet     . Probiotic Product (SOLUBLE FIBER/PROBIOTICS PO) Take 1 capsule by mouth daily.     Marland Kitchen tretinoin microspheres (RETIN-A MICRO) 0.04 % gel Apply topically at bedtime.     Marland Kitchen UNABLE TO FIND 1 capsule 2 (two) times daily. Cleanse More OTC    . venlafaxine XR (EFFEXOR-XR) 75 MG 24 hr capsule TAKE 1 CAPSULE (75 MG TOTAL) BY MOUTH DAILY. 90 capsule 1  . vitamin E (VITAMIN E) 400 UNIT capsule Take 400 Units by mouth 3 (three) times daily.    Marland Kitchen exemestane (AROMASIN) 25 MG tablet Take 1 tablet (25 mg total) by mouth daily after breakfast. 30 tablet 0   No current facility-administered medications for this visit.    PHYSICAL EXAMINATION: ECOG PERFORMANCE STATUS: 1 - Symptomatic but completely ambulatory  Filed Vitals:   04/11/14 1203  BP: 122/59  Pulse: 79  Temp: 97.8 F (36.6 C)  Resp: 18   Filed Weights   04/11/14 1203  Weight: 165 lb 8 oz (75.07 kg)    GENERAL:alert, no distress and comfortable SKIN: skin color, texture, turgor are normal, no rashes or significant lesions EYES: normal, Conjunctiva are pink and non-injected, sclera clear OROPHARYNX:no exudate, no erythema and lips, buccal mucosa, and tongue normal  NECK: supple, thyroid normal size, non-tender, without nodularity LYMPH:  no palpable lymphadenopathy in the cervical, axillary or inguinal LUNGS: clear to auscultation and percussion with normal breathing effort HEART: regular rate & rhythm and no murmurs and no lower extremity edema ABDOMEN:abdomen soft, non-tender and normal bowel sounds Musculoskeletal:no cyanosis of digits and no  clubbing  NEURO: alert & oriented x 3 with fluent speech, no focal motor/sensory deficits  LABORATORY DATA:  I have reviewed the data as listed   Chemistry      Component Value Date/Time   NA 141 02/21/2014 1057   NA 139 01/20/2012 1136   K 3.9 02/21/2014 1057   K 4.1 01/20/2012 1136   CL 103 01/20/2012 1136   CO2 29 02/21/2014 1057   CO2 30 01/20/2012 1136   BUN 7.4 02/21/2014 1057   BUN 9 01/20/2012 1136   CREATININE 0.8 02/21/2014 1057   CREATININE 0.72 01/20/2012 1136      Component Value Date/Time   CALCIUM 9.9 02/21/2014 1057   CALCIUM 9.7 01/20/2012 1136   ALKPHOS 88 02/21/2014 1057   ALKPHOS 72 01/20/2012 1136   AST 24 02/21/2014 1057   AST 19 01/20/2012 1136   ALT 8 02/21/2014 1057   ALT 11 01/20/2012 1136   BILITOT 0.41 02/21/2014 1057   BILITOT 0.2* 01/20/2012 1136       Lab Results  Component Value Date   WBC 3.9 02/21/2014   HGB 11.9 02/21/2014   HCT 36.8 02/21/2014  MCV 92.3 02/21/2014   PLT 320 02/21/2014   NEUTROABS 2.0 02/21/2014   ASSESSMENT & PLAN:  Cancer of upper-outer quadrant of female breast The right breast invasive ductal carcinoma ER/PR positive HER-2 negative status post lumpectomy and radiation and has been on Arimidex since May 2013  Aromatase inhibitor toxicities: Vaginal dryness and dyspareunia as well as diffuse muscle aches and pains. I discussed different options with her including using Claritin-D for muscle aches and pains as well as switching her to a different aromatase inhibitor. After lengthy discussion, we elected to switch her from Arimidex to exemestane. I would like to see her back in one month to assess tolerance to exemestane.  Vaginal dryness: Patient is using Replens and coconut oil Surveillance: Patient has a mammogram set up for Monday. I will see her back in a month to do a breast exam and followup.   No orders of the defined types were placed in this encounter.   The patient has a good understanding of the  overall plan. she agrees with it. She will call with any problems that may develop before her next visit here.  I spent 20 minutes counseling the patient face to face. The total time spent in the appointment was 25 minutes and more than 50% was on counseling and review of test results    Rulon Eisenmenger, MD 04/11/2014 12:38 PM

## 2014-04-14 ENCOUNTER — Ambulatory Visit
Admission: RE | Admit: 2014-04-14 | Discharge: 2014-04-14 | Disposition: A | Discharge status: Home / Self Care | Payer: 59 | Source: Ambulatory Visit | Attending: Internal Medicine | Admitting: Internal Medicine

## 2014-04-14 ENCOUNTER — Telehealth: Payer: Self-pay | Admitting: Hematology and Oncology

## 2014-04-14 DIAGNOSIS — Z853 Personal history of malignant neoplasm of breast: Secondary | ICD-10-CM

## 2014-04-14 NOTE — Telephone Encounter (Signed)
, °

## 2014-05-05 ENCOUNTER — Other Ambulatory Visit: Payer: Self-pay | Admitting: Adult Health

## 2014-05-05 DIAGNOSIS — C50419 Malignant neoplasm of upper-outer quadrant of unspecified female breast: Secondary | ICD-10-CM

## 2014-05-12 ENCOUNTER — Other Ambulatory Visit: Payer: Self-pay | Admitting: Adult Health

## 2014-05-12 DIAGNOSIS — C50411 Malignant neoplasm of upper-outer quadrant of right female breast: Secondary | ICD-10-CM

## 2014-05-13 ENCOUNTER — Telehealth: Payer: Self-pay | Admitting: Hematology and Oncology

## 2014-05-13 ENCOUNTER — Ambulatory Visit (HOSPITAL_BASED_OUTPATIENT_CLINIC_OR_DEPARTMENT_OTHER): Payer: 59 | Admitting: Hematology and Oncology

## 2014-05-13 VITALS — BP 133/34 | HR 89 | Temp 99.7°F | Resp 18 | Ht 66.0 in | Wt 165.6 lb

## 2014-05-13 DIAGNOSIS — Z170 Estrogen receptor positive status [ER+]: Secondary | ICD-10-CM

## 2014-05-13 DIAGNOSIS — M791 Myalgia: Secondary | ICD-10-CM

## 2014-05-13 DIAGNOSIS — C50411 Malignant neoplasm of upper-outer quadrant of right female breast: Secondary | ICD-10-CM

## 2014-05-13 DIAGNOSIS — N9489 Other specified conditions associated with female genital organs and menstrual cycle: Secondary | ICD-10-CM

## 2014-05-13 DIAGNOSIS — N951 Menopausal and female climacteric states: Secondary | ICD-10-CM

## 2014-05-13 MED ORDER — TAMOXIFEN CITRATE 20 MG PO TABS: 20.0000 mg | ORAL_TABLET | Freq: Every day | ORAL | Status: DC

## 2014-05-13 NOTE — Telephone Encounter (Signed)
, °

## 2014-05-13 NOTE — Progress Notes (Signed)
Patient Care Team: Wenda Low, MD as PCP - General (Internal Medicine) Haywood Lasso, MD (General Surgery) Eston Esters, MD (Hematology and Oncology) Rexene Edison, MD (Radiation Oncology)  DIAGNOSIS: Primary cancer of upper outer quadrant of right female breast   Staging form: Breast, AJCC 7th Edition     Clinical: Stage IA (T1, N0, cM0) - Unsigned       Prognostic indicators: ER 100%  PR 100%  Her 2 -  Ki67=8%      Pathologic: Stage IIA (T1, N1a, cM0) - Signed by Haywood Lasso, MD on 06/03/2011       Prognostic indicators: ER 100%  PR 100%  Her 2 -  Ki67=8% SUMMARY OF ONCOLOGIC HISTORY:   Primary cancer of upper outer quadrant of right female breast   05/04/2011 Initial Diagnosis  invasive ductal carcinoma ER positive PR positive Ki-67 8% HER-2 negative   05/09/2011 Breast MRI  MRIs revealed solitary enhancing mass upper outer quadrant right breast with multiple subcentimeter lesions within the liver suspected to be cysts   06/01/2011 Surgery  right lumpectomy  1.7 cm low-grade IDC with lymphovascular invasion 1 /dew SLM positive with the deposit measuring 0.8 cm no extracapsular extension , Oncotype DX recurrence score 14 , 9% ROR low risk   08/12/2011 - 09/21/2011 Radiation Therapy  adjuvant radiation therapy   10/12/2011 -  Anti-estrogen oral therapy  Arimidex 1 mg daily from May 2013 to 04/28/2014 and then switch to exemestane 25 mg daily for muscle aches and pains and vaginal dryness, switched to tamoxifen 20 mg daily 05/13/2014    CHIEF COMPLIANT: follow-up on exemestane  INTERVAL HISTORY: Casey Wall is a 58 year old African-American female with above-mentioned history of breast cancer treated with right-sided lumpectomy followed by adjuvant radiation therapy and has been on oral antiestrogen therapy since May 2013. She has been tolerating Arimidex more poorly and hence we switched her to exemestane. She is here 1 month after switching to exemestane to discuss symptoms.  She reports that symptoms have not Been any better. They have not gotten worse but she continues to have extreme pain that makes her tearful and crying at night.  REVIEW OF SYSTEMS:   Constitutional: Denies fevers, chills or abnormal weight loss Eyes: Denies blurriness of vision Ears, nose, mouth, throat, and face: Denies mucositis or sore throat Respiratory: Denies cough, dyspnea or wheezes Cardiovascular: Denies palpitation, chest discomfort or lower extremity swelling Gastrointestinal:  Denies nausea, heartburn or change in bowel habits Skin: Denies abnormal skin rashes Lymphatics: Denies new lymphadenopathy or easy bruising Neurological:Denies numbness, tingling or new weaknesses Behavioral/Psych: Mood is stable, no new changes  Breast:  denies any pain or lumps or nodules in either breasts All other systems were reviewed with the patient and are negative.  I have reviewed the past medical history, past surgical history, social history and family history with the patient and they are unchanged from previous note.  ALLERGIES:  has No Known Allergies.  MEDICATIONS:  Current Outpatient Prescriptions  Medication Sig Dispense Refill  . cetirizine (ZYRTEC) 10 MG tablet Take 10 mg by mouth daily.    . Cholecalciferol (VITAMIN D PO) Take 2,000 mg by mouth daily.     . DENTAGEL 1.1 % GEL dental gel     . fluticasone (FLONASE) 50 MCG/ACT nasal spray Place 2 sprays into the nose as needed.     . Melatonin 1 MG CAPS Take 1 capsule by mouth at bedtime as needed. Pt. Not sure of dose    .  pantoprazole (PROTONIX) 40 MG tablet     . Probiotic Product (SOLUBLE FIBER/PROBIOTICS PO) Take 1 capsule by mouth daily.     . tamoxifen (NOLVADEX) 20 MG tablet Take 1 tablet (20 mg total) by mouth daily. 90 tablet 3  . tretinoin microspheres (RETIN-A MICRO) 0.04 % gel Apply topically at bedtime.     Marland Kitchen UNABLE TO FIND 1 capsule 2 (two) times daily. Cleanse More OTC    . venlafaxine XR (EFFEXOR-XR) 75 MG 24 hr  capsule TAKE 1 CAPSULE (75 MG TOTAL) BY MOUTH DAILY. 90 capsule 1  . vitamin E (VITAMIN E) 400 UNIT capsule Take 400 Units by mouth 3 (three) times daily.     No current facility-administered medications for this visit.    PHYSICAL EXAMINATION: ECOG PERFORMANCE STATUS: 1 - Symptomatic but completely ambulatory  Filed Vitals:   05/13/14 1030  BP: 133/34  Pulse: 89  Temp: 99.7 F (37.6 C)  Resp: 18   Filed Weights   05/13/14 1030  Weight: 165 lb 9.6 oz (75.116 kg)    GENERAL:alert, no distress and comfortable SKIN: skin color, texture, turgor are normal, no rashes or significant lesions EYES: normal, Conjunctiva are pink and non-injected, sclera clear OROPHARYNX:no exudate, no erythema and lips, buccal mucosa, and tongue normal  NECK: supple, thyroid normal size, non-tender, without nodularity LYMPH:  no palpable lymphadenopathy in the cervical, axillary or inguinal LUNGS: clear to auscultation and percussion with normal breathing effort HEART: regular rate & rhythm and no murmurs and no lower extremity edema ABDOMEN:abdomen soft, non-tender and normal bowel sounds Musculoskeletal:no cyanosis of digits and no clubbing  NEURO: alert & oriented x 3 with fluent speech, no focal motor/sensory deficits  LABORATORY DATA:  I have reviewed the data as listed   Chemistry      Component Value Date/Time   NA 141 02/21/2014 1057   NA 139 01/20/2012 1136   K 3.9 02/21/2014 1057   K 4.1 01/20/2012 1136   CL 103 01/20/2012 1136   CO2 29 02/21/2014 1057   CO2 30 01/20/2012 1136   BUN 7.4 02/21/2014 1057   BUN 9 01/20/2012 1136   CREATININE 0.8 02/21/2014 1057   CREATININE 0.72 01/20/2012 1136      Component Value Date/Time   CALCIUM 9.9 02/21/2014 1057   CALCIUM 9.7 01/20/2012 1136   ALKPHOS 88 02/21/2014 1057   ALKPHOS 72 01/20/2012 1136   AST 24 02/21/2014 1057   AST 19 01/20/2012 1136   ALT 8 02/21/2014 1057   ALT 11 01/20/2012 1136   BILITOT 0.41 02/21/2014 1057    BILITOT 0.2* 01/20/2012 1136       Lab Results  Component Value Date   WBC 3.9 02/21/2014   HGB 11.9 02/21/2014   HCT 36.8 02/21/2014   MCV 92.3 02/21/2014   PLT 320 02/21/2014   NEUTROABS 2.0 02/21/2014     RADIOGRAPHIC STUDIES: I have personally reviewed the radiology reports and agreed with their findings. No results found.   ASSESSMENT & PLAN:  Primary cancer of upper outer quadrant of right female breast  Right breast invasive ductal carcinoma ER/PR positive HER-2 negative status post lumpectomy and radiation and started on Arimidex May 2013 with lots of muscle aches and pains , switched to exemestane November 2015 had same side effects , switched to tamoxifen 05/13/2014   Aromatase inhibitor toxicities 1.  Severe myalgias causing her to be tearful:  Discussed different remedies including  Effexor or Neurontin but patient wanted to not take any more  medications. 2.  Severe hot flashes 3.  Vaginal dryness : using Replens and coconut oil  Treatment plan: change in treatment to tamoxifen 20 mg daily. Discuss recent benefits and patient agreed to get started and will stop exemestane. She had a previous hysterectomy so there is no risk of bleeding.  Discussed the risk of DVTs.  If she continues to have the same symptoms even on tamoxifen, I worry that she might stop antiestrogen therapy.   Return to clinic in 3 months for follow-up.      Orders Placed This Encounter  Procedures  . CBC with Differential    Standing Status: Future     Number of Occurrences:      Standing Expiration Date: 05/13/2015  . Comprehensive metabolic panel (Cmet) - [facility]    Standing Status: Future     Number of Occurrences:      Standing Expiration Date: 05/13/2015   The patient has a good understanding of the overall plan. she agrees with it. She will call with any problems that may develop before her next visit here.   Rulon Eisenmenger, MD 05/13/2014 11:15 AM

## 2014-05-13 NOTE — Assessment & Plan Note (Signed)
Right breast invasive ductal carcinoma ER/PR positive HER-2 negative status post lumpectomy and radiation and started on Arimidex May 2013 with lots of muscle aches and pains , switched to exemestane November 2015 had same side effects , switched to tamoxifen 05/13/2014   Aromatase inhibitor toxicities 1.  Severe myalgias causing her to be tearful:  Discussed different remedies including  Effexor or Neurontin but patient wanted to not take any more medications. 2.  Severe hot flashes 3.  Vaginal dryness : using Replens and coconut oil  Treatment plan: change in treatment to tamoxifen 20 mg daily. Discuss recent benefits and patient agreed to get started and will stop exemestane. She had a previous hysterectomy so there is no risk of bleeding.  Discussed the risk of DVTs.  If she continues to have the same symptoms even on tamoxifen, I worry that she might stop antiestrogen therapy.   Return to clinic in 3 months for follow-up.

## 2014-08-21 ENCOUNTER — Other Ambulatory Visit (HOSPITAL_BASED_OUTPATIENT_CLINIC_OR_DEPARTMENT_OTHER): Payer: 59

## 2014-08-21 ENCOUNTER — Ambulatory Visit (HOSPITAL_BASED_OUTPATIENT_CLINIC_OR_DEPARTMENT_OTHER): Payer: 59 | Admitting: Hematology and Oncology

## 2014-08-21 VITALS — BP 128/65 | HR 84 | Temp 98.5°F | Resp 18 | Ht 66.0 in | Wt 170.4 lb

## 2014-08-21 DIAGNOSIS — R635 Abnormal weight gain: Secondary | ICD-10-CM

## 2014-08-21 DIAGNOSIS — D709 Neutropenia, unspecified: Secondary | ICD-10-CM

## 2014-08-21 DIAGNOSIS — C50411 Malignant neoplasm of upper-outer quadrant of right female breast: Secondary | ICD-10-CM | POA: Diagnosis not present

## 2014-08-21 DIAGNOSIS — D72819 Decreased white blood cell count, unspecified: Secondary | ICD-10-CM

## 2014-08-21 DIAGNOSIS — Z170 Estrogen receptor positive status [ER+]: Secondary | ICD-10-CM

## 2014-08-21 LAB — COMPREHENSIVE METABOLIC PANEL (CC13)
ALBUMIN: 3.4 g/dL — AB (ref 3.5–5.0)
ALK PHOS: 74 U/L (ref 40–150)
ALT: 13 U/L (ref 0–55)
AST: 27 U/L (ref 5–34)
Anion Gap: 8 mEq/L (ref 3–11)
BUN: 8.6 mg/dL (ref 7.0–26.0)
CO2: 27 mEq/L (ref 22–29)
Calcium: 8.8 mg/dL (ref 8.4–10.4)
Chloride: 105 mEq/L (ref 98–109)
Creatinine: 0.8 mg/dL (ref 0.6–1.1)
Glucose: 129 mg/dl (ref 70–140)
Potassium: 4.3 mEq/L (ref 3.5–5.1)
SODIUM: 140 meq/L (ref 136–145)
TOTAL PROTEIN: 6.9 g/dL (ref 6.4–8.3)
Total Bilirubin: 0.2 mg/dL (ref 0.20–1.20)

## 2014-08-21 LAB — CBC WITH DIFFERENTIAL/PLATELET
BASO%: 1.9 % (ref 0.0–2.0)
Basophils Absolute: 0.1 10*3/uL (ref 0.0–0.1)
EOS ABS: 0.1 10*3/uL (ref 0.0–0.5)
EOS%: 2.2 % (ref 0.0–7.0)
HCT: 34.3 % — ABNORMAL LOW (ref 34.8–46.6)
HEMOGLOBIN: 11.2 g/dL — AB (ref 11.6–15.9)
LYMPH%: 46.4 % (ref 14.0–49.7)
MCH: 29.8 pg (ref 25.1–34.0)
MCHC: 32.7 g/dL (ref 31.5–36.0)
MCV: 91.2 fL (ref 79.5–101.0)
MONO#: 0.6 10*3/uL (ref 0.1–0.9)
MONO%: 16.6 % — ABNORMAL HIGH (ref 0.0–14.0)
NEUT#: 1.2 10*3/uL — ABNORMAL LOW (ref 1.5–6.5)
NEUT%: 32.9 % — ABNORMAL LOW (ref 38.4–76.8)
PLATELETS: 248 10*3/uL (ref 145–400)
RBC: 3.76 10*6/uL (ref 3.70–5.45)
RDW: 12.8 % (ref 11.2–14.5)
WBC: 3.6 10*3/uL — ABNORMAL LOW (ref 3.9–10.3)
lymph#: 1.7 10*3/uL (ref 0.9–3.3)

## 2014-08-21 NOTE — Assessment & Plan Note (Signed)
Right breast invasive ductal carcinoma ER/PR positive HER-2 negative status post lumpectomy and radiation Oncotype DX recurrence score 14, 9% ROR and started on Arimidex May 2013 with lots of muscle aches and pains , switched to exemestane November 2015 had same side effects , switched to tamoxifen 05/13/2014  Aromatase inhibitor toxicities: Severe myalgias causing her to be tearful: Discussed different remedies including Effexor or Neurontin but patient wanted to not take any more medications;  Severe hot flashes Vaginal dryness : using Replens and coconut oil  Tamoxifen toxicities:  Breast cancer surveillance: 1. Breast exam 08/21/2014 is normal 2. Mammogram 04/14/2014 is normal   Return to clinic in 6 months for follow-up. 

## 2014-08-21 NOTE — Progress Notes (Signed)
Patient Care Team: Wenda Low, MD as PCP - General (Internal Medicine) Neldon Mc, MD (General Surgery) Eston Esters, MD (Hematology and Oncology) Arloa Koh, MD (Radiation Oncology)  DIAGNOSIS: Primary cancer of upper outer quadrant of right female breast   Staging form: Breast, AJCC 7th Edition     Clinical: Stage IA (T1, N0, cM0) - Unsigned       Prognostic indicators: ER 100%  PR 100%  Her 2 -  Ki67=8%      Pathologic: Stage IIA (T1, N1a, cM0) - Signed by Haywood Lasso, MD on 06/03/2011       Prognostic indicators: ER 100%  PR 100%  Her 2 -  Ki67=8%    SUMMARY OF ONCOLOGIC HISTORY:   Primary cancer of upper outer quadrant of right female breast   05/04/2011 Initial Diagnosis  invasive ductal carcinoma ER positive PR positive Ki-67 8% HER-2 negative   05/09/2011 Breast MRI  MRIs revealed solitary enhancing mass upper outer quadrant right breast with multiple subcentimeter lesions within the liver suspected to be cysts   06/01/2011 Surgery  right lumpectomy  1.7 cm low-grade IDC with lymphovascular invasion 1 /dew SLM positive with the deposit measuring 0.8 cm no extracapsular extension , Oncotype DX recurrence score 14 , 9% ROR low risk   08/12/2011 - 09/21/2011 Radiation Therapy  adjuvant radiation therapy   10/12/2011 -  Anti-estrogen oral therapy  Arimidex 1 mg daily from May 2013 to 04/28/2014 and then switch to exemestane 25 mg daily for muscle aches and pains and vaginal dryness, switched to tamoxifen 20 mg daily 05/13/2014    CHIEF COMPLIANT: Follow-up on tamoxifen  INTERVAL HISTORY: Casey Wall is a 59 year old lady with above-mentioned history of right breast cancer treated with lumpectomy and radiation is currently on antiestrogen therapy. She could not tolerate aromatase inhibitors because of muscle aches and pains and was switched to tamoxifen. She is tolerating tamoxifen much better. She has much less aches and pains. But since she's been on tamoxifen  she's been gaining weight subtotal 15 pounds. Patient had an implant placement a few weeks ago and was on antibiotic.  REVIEW OF SYSTEMS:   Constitutional: Denies fevers, chills or abnormal weight loss Eyes: Denies blurriness of vision Ears, nose, mouth, throat, and face: Denies mucositis or sore throat Respiratory: Denies cough, dyspnea or wheezes Cardiovascular: Denies palpitation, chest discomfort or lower extremity swelling Gastrointestinal:  Denies nausea, heartburn or change in bowel habits Skin: Denies abnormal skin rashes Lymphatics: Denies new lymphadenopathy or easy bruising Neurological:Denies numbness, tingling or new weaknesses Behavioral/Psych: Mood is stable, no new changes  Breast:  denies any pain or lumps or nodules in either breasts All other systems were reviewed with the patient and are negative.  I have reviewed the past medical history, past surgical history, social history and family history with the patient and they are unchanged from previous note.  ALLERGIES:  has No Known Allergies.  MEDICATIONS:  Current Outpatient Prescriptions  Medication Sig Dispense Refill  . cetirizine (ZYRTEC) 10 MG tablet Take 10 mg by mouth daily.    . Cholecalciferol (VITAMIN D PO) Take 2,000 mg by mouth daily.     . DENTAGEL 1.1 % GEL dental gel     . fluticasone (FLONASE) 50 MCG/ACT nasal spray Place 2 sprays into the nose as needed.     . Melatonin 1 MG CAPS Take 1 capsule by mouth at bedtime as needed. Pt. Not sure of dose    . pantoprazole (PROTONIX) 40 MG  tablet     . Probiotic Product (SOLUBLE FIBER/PROBIOTICS PO) Take 1 capsule by mouth daily.     . tamoxifen (NOLVADEX) 20 MG tablet Take 1 tablet (20 mg total) by mouth daily. 90 tablet 3  . tretinoin microspheres (RETIN-A MICRO) 0.04 % gel Apply topically at bedtime.     Marland Kitchen UNABLE TO FIND 1 capsule 2 (two) times daily. Cleanse More OTC    . venlafaxine XR (EFFEXOR-XR) 75 MG 24 hr capsule TAKE 1 CAPSULE (75 MG TOTAL) BY  MOUTH DAILY. 90 capsule 1  . vitamin E (VITAMIN E) 400 UNIT capsule Take 400 Units by mouth 3 (three) times daily.     No current facility-administered medications for this visit.    PHYSICAL EXAMINATION: ECOG PERFORMANCE STATUS: 1 - Symptomatic but completely ambulatory  Filed Vitals:   08/21/14 1204  BP: 128/65  Pulse: 84  Temp: 98.5 F (36.9 C)  Resp: 18   Filed Weights   08/21/14 1204  Weight: 170 lb 6.4 oz (77.293 kg)    GENERAL:alert, no distress and comfortable SKIN: skin color, texture, turgor are normal, no rashes or significant lesions EYES: normal, Conjunctiva are pink and non-injected, sclera clear OROPHARYNX:no exudate, no erythema and lips, buccal mucosa, and tongue normal  NECK: supple, thyroid normal size, non-tender, without nodularity LYMPH:  no palpable lymphadenopathy in the cervical, axillary or inguinal LUNGS: clear to auscultation and percussion with normal breathing effort HEART: regular rate & rhythm and no murmurs and no lower extremity edema ABDOMEN:abdomen soft, non-tender and normal bowel sounds Musculoskeletal:no cyanosis of digits and no clubbing  NEURO: alert & oriented x 3 with fluent speech, no focal motor/sensory deficits BREAST: No palpable masses or nodules in either right or left breasts. No palpable axillary supraclavicular or infraclavicular adenopathy no breast tenderness or nipple discharge. (exam performed in the presence of a chaperone)  LABORATORY DATA:  I have reviewed the data as listed   Chemistry      Component Value Date/Time   NA 140 08/21/2014 1120   NA 139 01/20/2012 1136   K 4.3 08/21/2014 1120   K 4.1 01/20/2012 1136   CL 103 01/20/2012 1136   CO2 27 08/21/2014 1120   CO2 30 01/20/2012 1136   BUN 8.6 08/21/2014 1120   BUN 9 01/20/2012 1136   CREATININE 0.8 08/21/2014 1120   CREATININE 0.72 01/20/2012 1136      Component Value Date/Time   CALCIUM 8.8 08/21/2014 1120   CALCIUM 9.7 01/20/2012 1136   ALKPHOS 74  08/21/2014 1120   ALKPHOS 72 01/20/2012 1136   AST 27 08/21/2014 1120   AST 19 01/20/2012 1136   ALT 13 08/21/2014 1120   ALT 11 01/20/2012 1136   BILITOT 0.20 08/21/2014 1120   BILITOT 0.2* 01/20/2012 1136       Lab Results  Component Value Date   WBC 3.6* 08/21/2014   HGB 11.2* 08/21/2014   HCT 34.3* 08/21/2014   MCV 91.2 08/21/2014   PLT 248 08/21/2014   NEUTROABS 1.2* 08/21/2014     RADIOGRAPHIC STUDIES: I have personally reviewed the radiology reports and agreed with their findings. Mammogram November 2015 is normal  ASSESSMENT & PLAN:  Primary cancer of upper outer quadrant of right female breast Right breast invasive ductal carcinoma ER/PR positive HER-2 negative status post lumpectomy and radiation Oncotype DX recurrence score 14, 9% ROR and started on Arimidex May 2013 with lots of muscle aches and pains , switched to exemestane November 2015 had same side effects ,  switched to tamoxifen 05/13/2014  Aromatase inhibitor toxicities: Severe myalgias causing her to be tearful: Discussed different remedies including Effexor or Neurontin but patient wanted to not take any more medications;  Severe hot flashes Vaginal dryness : using Replens and coconut oil  Tamoxifen toxicities: Not as much myalgias but has weight gain and hot flashes.  Leukopenia primarily neutropenia: ANC 1200: This is most likely related to the recent antibiotic use. We will repeat blood work when she comes back to see [place] in 6 months.  Weight issues: Patient changed her diet and she does not eat any more red meat. She is also trying to not eat processed foods and sugary foods. I instructed her that she needs to join a gym and get a personal trainer to help lose weight.  Breast cancer surveillance: 1. Breast exam 08/21/2014 is normal 2. Mammogram 04/14/2014 is normal   Return to clinic in 6 months for follow-up.    No orders of the defined types were placed in this encounter.   The patient  has a good understanding of the overall plan. she agrees with it. She will call with any problems that may develop before her next visit here.   Rulon Eisenmenger, MD

## 2014-08-22 ENCOUNTER — Other Ambulatory Visit: Payer: 59

## 2014-08-22 ENCOUNTER — Ambulatory Visit: Payer: 59 | Admitting: Adult Health

## 2014-09-17 ENCOUNTER — Other Ambulatory Visit: Payer: Self-pay | Admitting: Internal Medicine

## 2014-09-17 DIAGNOSIS — K219 Gastro-esophageal reflux disease without esophagitis: Secondary | ICD-10-CM

## 2014-09-17 DIAGNOSIS — K279 Peptic ulcer, site unspecified, unspecified as acute or chronic, without hemorrhage or perforation: Secondary | ICD-10-CM

## 2014-09-18 ENCOUNTER — Ambulatory Visit
Admission: RE | Admit: 2014-09-18 | Discharge: 2014-09-18 | Disposition: A | Discharge status: Home / Self Care | Payer: 59 | Source: Ambulatory Visit | Attending: Internal Medicine | Admitting: Internal Medicine

## 2014-09-18 DIAGNOSIS — K219 Gastro-esophageal reflux disease without esophagitis: Secondary | ICD-10-CM

## 2014-09-18 DIAGNOSIS — K279 Peptic ulcer, site unspecified, unspecified as acute or chronic, without hemorrhage or perforation: Secondary | ICD-10-CM

## 2015-02-19 NOTE — Assessment & Plan Note (Signed)
Right breast invasive ductal carcinoma ER/PR positive HER-2 negative status post lumpectomy and radiation Oncotype DX recurrence score 14, 9% ROR and started on Arimidex May 2013 with lots of muscle aches and pains , switched to exemestane November 2015 had same side effects , switched to tamoxifen 05/13/2014  Aromatase inhibitor toxicities: Severe myalgias causing her to be tearful: Discussed different remedies including Effexor or Neurontin but patient wanted to not take any more medications; Severe hot flashes Vaginal dryness : using Replens and coconut oil  Tamoxifen toxicities: Not as much myalgias but has weight gain and hot flashes. Leukopenia primarily neutropenia:   Weight issues: Patient changed her diet and she does not eat any more red meat. She is also trying to not eat processed foods and sugary foods. I instructed her that she needs to join a gym and get a personal trainer to help lose weight.  Breast cancer surveillance: 1. Breast exam 02/19/2015 is normal 2. Mammogram 04/14/2014 is normal  Return to clinic in 6 months for follow-up.

## 2015-02-20 ENCOUNTER — Other Ambulatory Visit (HOSPITAL_BASED_OUTPATIENT_CLINIC_OR_DEPARTMENT_OTHER): Payer: 59

## 2015-02-20 ENCOUNTER — Encounter: Payer: Self-pay | Admitting: Hematology and Oncology

## 2015-02-20 ENCOUNTER — Ambulatory Visit (HOSPITAL_BASED_OUTPATIENT_CLINIC_OR_DEPARTMENT_OTHER): Payer: 59 | Admitting: Hematology and Oncology

## 2015-02-20 ENCOUNTER — Telehealth: Payer: Self-pay | Admitting: Hematology and Oncology

## 2015-02-20 VITALS — BP 120/68 | HR 80 | Temp 98.7°F | Resp 18 | Ht 66.0 in | Wt 165.9 lb

## 2015-02-20 DIAGNOSIS — E559 Vitamin D deficiency, unspecified: Secondary | ICD-10-CM

## 2015-02-20 DIAGNOSIS — C50411 Malignant neoplasm of upper-outer quadrant of right female breast: Secondary | ICD-10-CM

## 2015-02-20 LAB — COMPREHENSIVE METABOLIC PANEL (CC13)
ALK PHOS: 68 U/L (ref 40–150)
ALT: 14 U/L (ref 0–55)
ANION GAP: 6 meq/L (ref 3–11)
AST: 27 U/L (ref 5–34)
Albumin: 3.5 g/dL (ref 3.5–5.0)
BUN: 6.9 mg/dL — ABNORMAL LOW (ref 7.0–26.0)
CO2: 28 meq/L (ref 22–29)
Calcium: 9.3 mg/dL (ref 8.4–10.4)
Chloride: 108 mEq/L (ref 98–109)
Creatinine: 0.8 mg/dL (ref 0.6–1.1)
EGFR: 89 mL/min/{1.73_m2} — AB (ref >90)
GLUCOSE: 89 mg/dL (ref 70–140)
Potassium: 4 mEq/L (ref 3.5–5.1)
SODIUM: 142 meq/L (ref 136–145)
Total Bilirubin: <0.3 mg/dL (ref 0.20–1.20)
Total Protein: 7 g/dL (ref 6.4–8.3)

## 2015-02-20 LAB — CBC WITH DIFFERENTIAL/PLATELET
BASO%: 0.9 % (ref 0.0–2.0)
BASOS ABS: 0 10*3/uL (ref 0.0–0.1)
EOS%: 1 % (ref 0.0–7.0)
Eosinophils Absolute: 0 10*3/uL (ref 0.0–0.5)
HCT: 35.3 % (ref 34.8–46.6)
HGB: 11.6 g/dL (ref 11.6–15.9)
LYMPH%: 34.1 % (ref 14.0–49.7)
MCH: 29.5 pg (ref 25.1–34.0)
MCHC: 32.8 g/dL (ref 31.5–36.0)
MCV: 89.9 fL (ref 79.5–101.0)
MONO#: 0.4 10*3/uL (ref 0.1–0.9)
MONO%: 12.5 % (ref 0.0–14.0)
NEUT%: 51.5 % (ref 38.4–76.8)
NEUTROS ABS: 1.8 10*3/uL (ref 1.5–6.5)
Platelets: 285 10*3/uL (ref 145–400)
RBC: 3.92 10*6/uL (ref 3.70–5.45)
RDW: 13.2 % (ref 11.2–14.5)
WBC: 3.5 10*3/uL — ABNORMAL LOW (ref 3.9–10.3)
lymph#: 1.2 10*3/uL (ref 0.9–3.3)

## 2015-02-20 NOTE — Telephone Encounter (Signed)
Appointments made and avs printed for patient °

## 2015-02-20 NOTE — Progress Notes (Signed)
Patient Care Team: Wenda Low, MD as PCP - General (Internal Medicine) Neldon Mc, MD (General Surgery) Eston Esters, MD (Hematology and Oncology) Arloa Koh, MD (Radiation Oncology)  DIAGNOSIS: Primary cancer of upper outer quadrant of right female breast   Staging form: Breast, AJCC 7th Edition     Clinical: Stage IA (T1, N0, cM0) - Unsigned       Prognostic indicators: ER 100%  PR 100%  Her 2 -  Ki67=8%      Pathologic: Stage IIA (T1, N1a, cM0) - Signed by Haywood Lasso, MD on 06/03/2011       Prognostic indicators: ER 100%  PR 100%  Her 2 -  Ki67=8%  SUMMARY OF ONCOLOGIC HISTORY:   Primary cancer of upper outer quadrant of right female breast   05/04/2011 Initial Diagnosis  invasive ductal carcinoma ER positive PR positive Ki-67 8% HER-2 negative   05/09/2011 Breast MRI  MRIs revealed solitary enhancing mass upper outer quadrant right breast with multiple subcentimeter lesions within the liver suspected to be cysts   06/01/2011 Surgery  right lumpectomy  1.7 cm low-grade IDC with lymphovascular invasion 1 /dew SLM positive with the deposit measuring 0.8 cm no extracapsular extension , Oncotype DX recurrence score 14 , 9% ROR low risk   08/12/2011 - 09/21/2011 Radiation Therapy  adjuvant radiation therapy   10/12/2011 -  Anti-estrogen oral therapy  Arimidex 1 mg daily from May 2013 to 04/28/2014 and then switch to exemestane 25 mg daily for muscle aches and pains and vaginal dryness, switched to tamoxifen 20 mg daily 05/13/2014    CHIEF COMPLIANT: Follow-up on tamoxifen  INTERVAL HISTORY: Casey Wall is a 59 year old with above-mentioned history of right breast cancer was on Arimidex but could not tolerate it then switch to exemestane and could not tolerated for myalgias and finally was switched to tamoxifen December 2015. She's been doing remarkably well. He does not have any myalgias or aches or pains anymore. She does have occasional hot flashes. She continues to  complain of vaginal dryness. She has used all kinds of over-the-counter supplements but they have not been of much benefit. Her last mammogram done in November 2015 has been normal.  REVIEW OF SYSTEMS:   Constitutional: Denies fevers, chills or abnormal weight loss Eyes: Denies blurriness of vision Ears, nose, mouth, throat, and face: Denies mucositis or sore throat Respiratory: Denies cough, dyspnea or wheezes Cardiovascular: Denies palpitation, chest discomfort or lower extremity swelling Gastrointestinal:  Denies nausea, heartburn or change in bowel habits Skin: Denies abnormal skin rashes Lymphatics: Denies new lymphadenopathy or easy bruising Neurological:Denies numbness, tingling or new weaknesses Behavioral/Psych: Mood is stable, no new changes  Breast:  denies any pain or lumps or nodules in either breasts All other systems were reviewed with the patient and are negative.  I have reviewed the past medical history, past surgical history, social history and family history with the patient and they are unchanged from previous note.  ALLERGIES:  has No Known Allergies.  MEDICATIONS:  Current Outpatient Prescriptions  Medication Sig Dispense Refill  . cetirizine (ZYRTEC) 10 MG tablet Take 10 mg by mouth daily.    . Cholecalciferol (VITAMIN D PO) Take 2,000 mg by mouth daily.     . DENTAGEL 1.1 % GEL dental gel     . fluticasone (FLONASE) 50 MCG/ACT nasal spray Place 2 sprays into the nose as needed.     . Melatonin 1 MG CAPS Take 1 capsule by mouth at bedtime as needed. Pt.  Not sure of dose    . pantoprazole (PROTONIX) 40 MG tablet     . Probiotic Product (SOLUBLE FIBER/PROBIOTICS PO) Take 1 capsule by mouth daily.     . tamoxifen (NOLVADEX) 20 MG tablet Take 1 tablet (20 mg total) by mouth daily. 90 tablet 3  . tretinoin microspheres (RETIN-A MICRO) 0.04 % gel Apply topically at bedtime.     Marland Kitchen UNABLE TO FIND 1 capsule 2 (two) times daily. Cleanse More OTC    . venlafaxine XR  (EFFEXOR-XR) 75 MG 24 hr capsule TAKE 1 CAPSULE (75 MG TOTAL) BY MOUTH DAILY. 90 capsule 1  . vitamin E (VITAMIN E) 400 UNIT capsule Take 400 Units by mouth 3 (three) times daily.     No current facility-administered medications for this visit.    PHYSICAL EXAMINATION: ECOG PERFORMANCE STATUS: 1 - Symptomatic but completely ambulatory  Filed Vitals:   02/20/15 1106  BP: 120/68  Pulse: 80  Temp: 98.7 F (37.1 C)  Resp: 18   Filed Weights   02/20/15 1106  Weight: 165 lb 14.4 oz (75.252 kg)    GENERAL:alert, no distress and comfortable SKIN: skin color, texture, turgor are normal, no rashes or significant lesions EYES: normal, Conjunctiva are pink and non-injected, sclera clear OROPHARYNX:no exudate, no erythema and lips, buccal mucosa, and tongue normal  NECK: supple, thyroid normal size, non-tender, without nodularity LYMPH:  no palpable lymphadenopathy in the cervical, axillary or inguinal LUNGS: clear to auscultation and percussion with normal breathing effort HEART: regular rate & rhythm and no murmurs and no lower extremity edema ABDOMEN:abdomen soft, non-tender and normal bowel sounds Musculoskeletal:no cyanosis of digits and no clubbing  NEURO: alert & oriented x 3 with fluent speech, no focal motor/sensory deficits BREAST: No palpable masses or nodules in either right or left breasts. No palpable axillary supraclavicular or infraclavicular adenopathy no breast tenderness or nipple discharge. (exam performed in the presence of a chaperone)  LABORATORY DATA:  I have reviewed the data as listed   Chemistry      Component Value Date/Time   NA 140 08/21/2014 1120   NA 139 01/20/2012 1136   K 4.3 08/21/2014 1120   K 4.1 01/20/2012 1136   CL 103 01/20/2012 1136   CO2 27 08/21/2014 1120   CO2 30 01/20/2012 1136   BUN 8.6 08/21/2014 1120   BUN 9 01/20/2012 1136   CREATININE 0.8 08/21/2014 1120   CREATININE 0.72 01/20/2012 1136      Component Value Date/Time   CALCIUM  8.8 08/21/2014 1120   CALCIUM 9.7 01/20/2012 1136   ALKPHOS 74 08/21/2014 1120   ALKPHOS 72 01/20/2012 1136   AST 27 08/21/2014 1120   AST 19 01/20/2012 1136   ALT 13 08/21/2014 1120   ALT 11 01/20/2012 1136   BILITOT 0.20 08/21/2014 1120   BILITOT 0.2* 01/20/2012 1136       Lab Results  Component Value Date   WBC 3.5* 02/20/2015   HGB 11.6 02/20/2015   HCT 35.3 02/20/2015   MCV 89.9 02/20/2015   PLT 285 02/20/2015   NEUTROABS 1.8 02/20/2015   ASSESSMENT & PLAN:  Primary cancer of upper outer quadrant of right female breast Right breast invasive ductal carcinoma ER/PR positive HER-2 negative status post lumpectomy and radiation Oncotype DX recurrence score 14, 9% ROR and started on Arimidex May 2013 with lots of muscle aches and pains , switched to exemestane November 2015 had same side effects , switched to tamoxifen 05/13/2014  Aromatase inhibitor toxicities: Severe  myalgias causing her to be tearful: Discussed different remedies including Effexor or Neurontin but patient wanted to not take any more medications; Severe hot flashes Vaginal dryness : using Replens and coconut oil  Tamoxifen toxicities: Not as much myalgias but has weight gain and hot flashes. We could refer her to Weight Watchers program. Leukopenia primarily neutropenia: This has resolved  Weight issues: Patient changed her diet and she does not eat any more red meat. She is also trying to not eat processed foods and sugary foods. I instructed her that she needs to join a gym and get a personal trainer to help lose weight.  Breast cancer surveillance: 1. Breast exam 02/19/2015 is normal 2. Mammogram 04/14/2014 is normal  Return to clinic in 1 year for follow-up.  No orders of the defined types were placed in this encounter.   The patient has a good understanding of the overall plan. she agrees with it. she will call with any problems that may develop before the next visit here.   Rulon Eisenmenger,  MD

## 2015-02-21 LAB — VITAMIN D 25 HYDROXY (VIT D DEFICIENCY, FRACTURES): VIT D 25 HYDROXY: 27 ng/mL — AB (ref 30–100)

## 2015-02-28 ENCOUNTER — Encounter (HOSPITAL_BASED_OUTPATIENT_CLINIC_OR_DEPARTMENT_OTHER): Payer: Self-pay | Admitting: Emergency Medicine

## 2015-02-28 ENCOUNTER — Emergency Department (HOSPITAL_BASED_OUTPATIENT_CLINIC_OR_DEPARTMENT_OTHER)
Admission: EM | Admit: 2015-02-28 | Discharge: 2015-02-28 | Disposition: A | Discharge status: Home / Self Care | Payer: 59 | Attending: Emergency Medicine | Admitting: Emergency Medicine

## 2015-02-28 ENCOUNTER — Emergency Department (HOSPITAL_BASED_OUTPATIENT_CLINIC_OR_DEPARTMENT_OTHER): Payer: 59

## 2015-02-28 DIAGNOSIS — S0181XA Laceration without foreign body of other part of head, initial encounter: Secondary | ICD-10-CM

## 2015-02-28 DIAGNOSIS — K219 Gastro-esophageal reflux disease without esophagitis: Secondary | ICD-10-CM | POA: Diagnosis not present

## 2015-02-28 DIAGNOSIS — Z853 Personal history of malignant neoplasm of breast: Secondary | ICD-10-CM | POA: Diagnosis not present

## 2015-02-28 DIAGNOSIS — Y998 Other external cause status: Secondary | ICD-10-CM | POA: Insufficient documentation

## 2015-02-28 DIAGNOSIS — Z8719 Personal history of other diseases of the digestive system: Secondary | ICD-10-CM | POA: Diagnosis not present

## 2015-02-28 DIAGNOSIS — Y9389 Activity, other specified: Secondary | ICD-10-CM | POA: Diagnosis not present

## 2015-02-28 DIAGNOSIS — W182XXA Fall in (into) shower or empty bathtub, initial encounter: Secondary | ICD-10-CM | POA: Insufficient documentation

## 2015-02-28 DIAGNOSIS — Z862 Personal history of diseases of the blood and blood-forming organs and certain disorders involving the immune mechanism: Secondary | ICD-10-CM | POA: Insufficient documentation

## 2015-02-28 DIAGNOSIS — Z79899 Other long term (current) drug therapy: Secondary | ICD-10-CM | POA: Insufficient documentation

## 2015-02-28 DIAGNOSIS — S0990XA Unspecified injury of head, initial encounter: Secondary | ICD-10-CM | POA: Diagnosis present

## 2015-02-28 DIAGNOSIS — Y9289 Other specified places as the place of occurrence of the external cause: Secondary | ICD-10-CM | POA: Diagnosis not present

## 2015-02-28 DIAGNOSIS — S060X0A Concussion without loss of consciousness, initial encounter: Secondary | ICD-10-CM | POA: Diagnosis not present

## 2015-02-28 MED ORDER — ONDANSETRON HCL 4 MG PO TABS: 4.0000 mg | ORAL_TABLET | Freq: Four times a day (QID) | ORAL | Status: DC

## 2015-02-28 MED ORDER — IBUPROFEN 400 MG PO TABS
400.0000 mg | ORAL_TABLET | Freq: Once | ORAL | Status: AC
Start: 2015-02-28 — End: 2015-02-28
  Administered 2015-02-28: 400 mg via ORAL
  Filled 2015-02-28: qty 1

## 2015-02-28 MED ORDER — ONDANSETRON 4 MG PO TBDP
4.0000 mg | ORAL_TABLET | Freq: Once | ORAL | Status: AC
  Administered 2015-02-28: 4 mg via ORAL
  Filled 2015-02-28: qty 1

## 2015-02-28 NOTE — ED Notes (Signed)
Pt reports she slipped in shower and fell on left side of body, head hit the ceramic tile

## 2015-02-28 NOTE — ED Notes (Signed)
Pt reports nausea and small laceration noted to left eyelid

## 2015-02-28 NOTE — Discharge Instructions (Signed)
Concussion  A concussion, or closed-head injury, is a brain injury caused by a direct blow to the head or by a quick and sudden movement (jolt) of the head or neck. Concussions are usually not life-threatening. Even so, the effects of a concussion can be serious. If you have had a concussion before, you are more likely to experience concussion-like symptoms after a direct blow to the head.   CAUSES  · Direct blow to the head, such as from running into another player during a soccer game, being hit in a fight, or hitting your head on a hard surface.  · A jolt of the head or neck that causes the brain to move back and forth inside the skull, such as in a car crash.  SIGNS AND SYMPTOMS  The signs of a concussion can be hard to notice. Early on, they may be missed by you, family members, and health care providers. You may look fine but act or feel differently.  Symptoms are usually temporary, but they may last for days, weeks, or even longer. Some symptoms may appear right away while others may not show up for hours or days. Every head injury is different. Symptoms include:  · Mild to moderate headaches that will not go away.  · A feeling of pressure inside your head.  · Having more trouble than usual:  ¨ Learning or remembering things you have heard.  ¨ Answering questions.  ¨ Paying attention or concentrating.  ¨ Organizing daily tasks.  ¨ Making decisions and solving problems.  · Slowness in thinking, acting or reacting, speaking, or reading.  · Getting lost or being easily confused.  · Feeling tired all the time or lacking energy (fatigued).  · Feeling drowsy.  · Sleep disturbances.  ¨ Sleeping more than usual.  ¨ Sleeping less than usual.  ¨ Trouble falling asleep.  ¨ Trouble sleeping (insomnia).  · Loss of balance or feeling lightheaded or dizzy.  · Nausea or vomiting.  · Numbness or tingling.  · Increased sensitivity to:  ¨ Sounds.  ¨ Lights.  ¨ Distractions.  · Vision problems or eyes that tire  easily.  · Diminished sense of taste or smell.  · Ringing in the ears.  · Mood changes such as feeling sad or anxious.  · Becoming easily irritated or angry for little or no reason.  · Lack of motivation.  · Seeing or hearing things other people do not see or hear (hallucinations).  DIAGNOSIS  Your health care provider can usually diagnose a concussion based on a description of your injury and symptoms. He or she will ask whether you passed out (lost consciousness) and whether you are having trouble remembering events that happened right before and during your injury.  Your evaluation might include:  · A brain scan to look for signs of injury to the brain. Even if the test shows no injury, you may still have a concussion.  · Blood tests to be sure other problems are not present.  TREATMENT  · Concussions are usually treated in an emergency department, in urgent care, or at a clinic. You may need to stay in the hospital overnight for further treatment.  · Tell your health care provider if you are taking any medicines, including prescription medicines, over-the-counter medicines, and natural remedies. Some medicines, such as blood thinners (anticoagulants) and aspirin, may increase the chance of complications. Also tell your health care provider whether you have had alcohol or are taking illegal drugs. This information   may affect treatment.  · Your health care provider will send you home with important instructions to follow.  · How fast you will recover from a concussion depends on many factors. These factors include how severe your concussion is, what part of your brain was injured, your age, and how healthy you were before the concussion.  · Most people with mild injuries recover fully. Recovery can take time. In general, recovery is slower in older persons. Also, persons who have had a concussion in the past or have other medical problems may find that it takes longer to recover from their current injury.  HOME  CARE INSTRUCTIONS  General Instructions  · Carefully follow the directions your health care provider gave you.  · Only take over-the-counter or prescription medicines for pain, discomfort, or fever as directed by your health care provider.  · Take only those medicines that your health care provider has approved.  · Do not drink alcohol until your health care provider says you are well enough to do so. Alcohol and certain other drugs may slow your recovery and can put you at risk of further injury.  · If it is harder than usual to remember things, write them down.  · If you are easily distracted, try to do one thing at a time. For example, do not try to watch TV while fixing dinner.  · Talk with family members or close friends when making important decisions.  · Keep all follow-up appointments. Repeated evaluation of your symptoms is recommended for your recovery.  · Watch your symptoms and tell others to do the same. Complications sometimes occur after a concussion. Older adults with a brain injury may have a higher risk of serious complications, such as a blood clot on the brain.  · Tell your teachers, school nurse, school counselor, coach, athletic trainer, or work manager about your injury, symptoms, and restrictions. Tell them about what you can or cannot do. They should watch for:  ¨ Increased problems with attention or concentration.  ¨ Increased difficulty remembering or learning new information.  ¨ Increased time needed to complete tasks or assignments.  ¨ Increased irritability or decreased ability to cope with stress.  ¨ Increased symptoms.  · Rest. Rest helps the brain to heal. Make sure you:  ¨ Get plenty of sleep at night. Avoid staying up late at night.  ¨ Keep the same bedtime hours on weekends and weekdays.  ¨ Rest during the day. Take daytime naps or rest breaks when you feel tired.  · Limit activities that require a lot of thought or concentration. These include:  ¨ Doing homework or job-related  work.  ¨ Watching TV.  ¨ Working on the computer.  · Avoid any situation where there is potential for another head injury (football, hockey, soccer, basketball, martial arts, downhill snow sports and horseback riding). Your condition will get worse every time you experience a concussion. You should avoid these activities until you are evaluated by the appropriate follow-up health care providers.  Returning To Your Regular Activities  You will need to return to your normal activities slowly, not all at once. You must give your body and brain enough time for recovery.  · Do not return to sports or other athletic activities until your health care provider tells you it is safe to do so.  · Ask your health care provider when you can drive, ride a bicycle, or operate heavy machinery. Your ability to react may be slower after a   brain injury. Never do these activities if you are dizzy.  · Ask your health care provider about when you can return to work or school.  Preventing Another Concussion  It is very important to avoid another brain injury, especially before you have recovered. In rare cases, another injury can lead to permanent brain damage, brain swelling, or death. The risk of this is greatest during the first 7-10 days after a head injury. Avoid injuries by:  · Wearing a seat belt when riding in a car.  · Drinking alcohol only in moderation.  · Wearing a helmet when biking, skiing, skateboarding, skating, or doing similar activities.  · Avoiding activities that could lead to a second concussion, such as contact or recreational sports, until your health care provider says it is okay.  · Taking safety measures in your home.  ¨ Remove clutter and tripping hazards from floors and stairways.  ¨ Use grab bars in bathrooms and handrails by stairs.  ¨ Place non-slip mats on floors and in bathtubs.  ¨ Improve lighting in dim areas.  SEEK MEDICAL CARE IF:  · You have increased problems paying attention or  concentrating.  · You have increased difficulty remembering or learning new information.  · You need more time to complete tasks or assignments than before.  · You have increased irritability or decreased ability to cope with stress.  · You have more symptoms than before.  Seek medical care if you have any of the following symptoms for more than 2 weeks after your injury:  · Lasting (chronic) headaches.  · Dizziness or balance problems.  · Nausea.  · Vision problems.  · Increased sensitivity to noise or light.  · Depression or mood swings.  · Anxiety or irritability.  · Memory problems.  · Difficulty concentrating or paying attention.  · Sleep problems.  · Feeling tired all the time.  SEEK IMMEDIATE MEDICAL CARE IF:  · You have severe or worsening headaches. These may be a sign of a blood clot in the brain.  · You have weakness (even if only in one hand, leg, or part of the face).  · You have numbness.  · You have decreased coordination.  · You vomit repeatedly.  · You have increased sleepiness.  · One pupil is larger than the other.  · You have convulsions.  · You have slurred speech.  · You have increased confusion. This may be a sign of a blood clot in the brain.  · You have increased restlessness, agitation, or irritability.  · You are unable to recognize people or places.  · You have neck pain.  · It is difficult to wake you up.  · You have unusual behavior changes.  · You lose consciousness.  MAKE SURE YOU:  · Understand these instructions.  · Will watch your condition.  · Will get help right away if you are not doing well or get worse.  Document Released: 08/06/2003 Document Revised: 05/21/2013 Document Reviewed: 12/06/2012  [employer]® Patient Information ©2015 [employer]. This information is not intended to replace advice given to you by your health care provider. Make sure you discuss any questions you have with your health care provider.

## 2015-02-28 NOTE — ED Notes (Signed)
Pt denies loss of consciousness with fall, but then stated she cant remember if she had loc or not

## 2015-02-28 NOTE — ED Provider Notes (Signed)
CSN: [id number]     Arrival date & time 02/28/15  1123 History   First MD Initiated Contact with Patient 02/28/15 1143     Chief Complaint  Patient presents with  . Fall     (Consider location/radiation/quality/duration/timing/severity/associated sxs/prior Treatment) Patient is a 59 y.o. female presenting with fall. The history is provided by the patient.  Fall This is a new (slipped in the shower and fell hitting her head on the ceramic tile) problem. The current episode started 1 to 2 hours ago. The problem occurs constantly. The problem has been gradually worsening. Associated symptoms include headaches. Associated symptoms comments: Left eye pain and lip swelling.  No syncope or LOC after the fall.  Nausea without vomiting.  No numbness or tingling in the limbs.  Able to walk without difficulty.  Minimal blurry vision of the left eye.. Nothing aggravates the symptoms. Nothing relieves the symptoms. She has tried nothing for the symptoms. The treatment provided no relief.    Past Medical History  Diagnosis Date  . Constipation   . Breast cancer ([place]) 04/2011    right  . Seasonal allergies   . History of fibrocystic disease of breast   . Anemia   . Stomach ulcer 2012  . GERD (gastroesophageal reflux disease) 2012  . Cancer of upper-outer quadrant of female breast ([place]) 05/10/2011  . Status post radiation therapy 08/08/11 - 09/21/11    Right Breast: 50.4 Gy/28 Fractions: Boost 10 Gy/ 5 Fractions  . Hot flashes related to aromatase inhibitor therapy     Arimidex   Past Surgical History  Procedure Laterality Date  . Total abdominal hysterectomy  1997  . Cesarean section  1980  . Cervical discectomy  2007  . Breast lumpectomy  06/01/11    right  . Breast cyst aspirated    . Herniated disc surgery  2007    neck   Family History  Problem Relation Age of Onset  . Lung cancer Father   . Cancer Father     lung  . Diabetes Mother   . Cancer Other    Social History  Substance  Use Topics  . Smoking status: Never Smoker   . Smokeless tobacco: Never Used  . Alcohol Use: Yes     Comment: occasionally   OB History    No data available     Review of Systems  Neurological: Positive for headaches.  All other systems reviewed and are negative.     Allergies  Review of patient's allergies indicates no known allergies.  Home Medications   Prior to Admission medications   Medication Sig Start Date End Date Taking? Authorizing Provider  cetirizine (ZYRTEC) 10 MG tablet Take 10 mg by mouth daily.   Yes Historical Provider, MD  Cholecalciferol (VITAMIN D PO) Take 2,000 mg by mouth daily.    Yes Historical Provider, MD  fluticasone (FLONASE) 50 MCG/ACT nasal spray Place 2 sprays into the nose as needed.    Yes Historical Provider, MD  pantoprazole (PROTONIX) 40 MG tablet  09/13/12  Yes Historical Provider, MD  Probiotic Product (SOLUBLE FIBER/PROBIOTICS PO) Take 1 capsule by mouth daily.    Yes Historical Provider, MD  tamoxifen (NOLVADEX) 20 MG tablet Take 1 tablet (20 mg total) by mouth daily. 05/13/14  Yes Nicholas Lose, MD  venlafaxine XR (EFFEXOR-XR) 75 MG 24 hr capsule TAKE 1 CAPSULE (75 MG TOTAL) BY MOUTH DAILY. 05/13/14  Yes Nicholas Lose, MD  vitamin E (VITAMIN E) 400 UNIT capsule Take 400  Units by mouth 3 (three) times daily.   Yes Historical Provider, MD  DENTAGEL 1.1 % GEL dental gel  06/28/13   Historical Provider, MD  tretinoin microspheres (RETIN-A MICRO) 0.04 % gel Apply topically at bedtime.     Historical Provider, MD  UNABLE TO FIND 1 capsule 2 (two) times daily. Cleanse More OTC    Historical Provider, MD   BP 126/85 mmHg  Pulse 93  Temp(Src) 98.8 F (37.1 C) (Oral)  Resp 18  Ht 5\' 7"  (1.702 m)  Wt 163 lb (73.936 kg)  BMI 25.52 kg/m2  SpO2 100% Physical Exam  Constitutional: She is oriented to person, place, and time. She appears well-developed and well-nourished. No distress.  HENT:  Head: Normocephalic. Head is with contusion and with  laceration.    Mouth/Throat: Oropharynx is clear and moist.    No dental trauma  Eyes: Conjunctivae and EOM are normal. Pupils are equal, round, and reactive to light.  Neck: Normal range of motion. Neck supple. Muscular tenderness present. No spinous process tenderness present.    Cardiovascular: Normal rate, regular rhythm and intact distal pulses.   No murmur heard. Pulmonary/Chest: Effort normal and breath sounds normal. No respiratory distress. She has no wheezes. She has no rales.  Abdominal: Soft. She exhibits no distension. There is no tenderness. There is no rebound and no guarding.  Musculoskeletal: Normal range of motion. She exhibits no edema or tenderness.  Neurological: She is alert and oriented to person, place, and time.  Skin: Skin is warm and dry. No rash noted. No erythema.  Psychiatric: She has a normal mood and affect. Her behavior is normal.  Nursing note and vitals reviewed.   ED Course  Procedures (including critical care time) Labs Review Labs Reviewed - No data to display  Imaging Review Ct Head Wo Contrast  02/28/2015   CLINICAL DATA:  Pain following fall from shower  EXAM: CT HEAD WITHOUT CONTRAST  CT MAXILLOFACIAL WITHOUT CONTRAST  TECHNIQUE: Multidetector CT imaging of the head and maxillofacial structures were performed using the standard protocol without intravenous contrast. Multiplanar CT image reconstructions of the maxillofacial structures were also generated.  COMPARISON:  None.  FINDINGS: CT HEAD FINDINGS  The ventricles are normal in size and configuration. There is no intracranial mass, hemorrhage, extra-axial fluid collection, or midline shift. Gray-white compartments are normal. No acute infarct evident. Bony calvarium appears intact. The mastoid air cells are clear. A small focus of basal ganglia calcification is felt to be physiologic.  CT MAXILLOFACIAL FINDINGS  There is no demonstrable fracture or dislocation. Orbits appear symmetric and  normal bilaterally. There is no intraorbital lesion on either side. There is minimal mucosal thickening in the medial right sphenoid sinus region. Other paranasal sinuses are clear. Ostiomeatal unit complexes are patent bilaterally. There is leftward deviation of the nasal septum. There is no nares obstruction. The salivary glands appear symmetric and normal bilaterally. No adenopathy appreciable. There is anterior screw and plate fixation at C3 and C4. There is moderate osteoarthritic change in the visualized cervical spine.  IMPRESSION: CT head: No intracranial mass, hemorrhage, or extra-axial fluid collection. No evidence of acute infarct.  CT maxillofacial: No demonstrable fracture or dislocation. Minimal mucosal thickening in the right sphenoid sinus. Other paranasal sinuses are clear. There is patency of the ostiomeatal unit complexes bilaterally. No intraorbital lesions. Leftward deviation of the nasal septum noted.   Electronically Signed   By: Lowella Grip III M.D.   On: 02/28/2015 12:27   Ct  Maxillofacial Wo Cm  02/28/2015   CLINICAL DATA:  Pain following fall from shower  EXAM: CT HEAD WITHOUT CONTRAST  CT MAXILLOFACIAL WITHOUT CONTRAST  TECHNIQUE: Multidetector CT imaging of the head and maxillofacial structures were performed using the standard protocol without intravenous contrast. Multiplanar CT image reconstructions of the maxillofacial structures were also generated.  COMPARISON:  None.  FINDINGS: CT HEAD FINDINGS  The ventricles are normal in size and configuration. There is no intracranial mass, hemorrhage, extra-axial fluid collection, or midline shift. Gray-white compartments are normal. No acute infarct evident. Bony calvarium appears intact. The mastoid air cells are clear. A small focus of basal ganglia calcification is felt to be physiologic.  CT MAXILLOFACIAL FINDINGS  There is no demonstrable fracture or dislocation. Orbits appear symmetric and normal bilaterally. There is no  intraorbital lesion on either side. There is minimal mucosal thickening in the medial right sphenoid sinus region. Other paranasal sinuses are clear. Ostiomeatal unit complexes are patent bilaterally. There is leftward deviation of the nasal septum. There is no nares obstruction. The salivary glands appear symmetric and normal bilaterally. No adenopathy appreciable. There is anterior screw and plate fixation at C3 and C4. There is moderate osteoarthritic change in the visualized cervical spine.  IMPRESSION: CT head: No intracranial mass, hemorrhage, or extra-axial fluid collection. No evidence of acute infarct.  CT maxillofacial: No demonstrable fracture or dislocation. Minimal mucosal thickening in the right sphenoid sinus. Other paranasal sinuses are clear. There is patency of the ostiomeatal unit complexes bilaterally. No intraorbital lesions. Leftward deviation of the nasal septum noted.   Electronically Signed   By: Lowella Grip III M.D.   On: 02/28/2015 12:27   I have personally reviewed and evaluated these images and lab results as part of my medical decision-making.   EKG Interpretation None      LACERATION REPAIR Performed by: Blanchie Dessert Authorized byBlanchie Dessert Consent: Verbal consent obtained. Risks and benefits: risks, benefits and alternatives were discussed Consent given by: patient Patient identity confirmed: provided demographic data Prepped and Draped in normal sterile fashion Wound explored  Laceration Location: left eyebrow  Laceration Length: 1cm  No Foreign Bodies seen or palpated  Anesthesia: none Irrigation method: syringe Amount of cleaning: standard  Skin closure: dermabond   Patient tolerance: Patient tolerated the procedure well with no immediate complications.   MDM   Final diagnoses:  Concussion, without loss of consciousness, initial encounter  Facial laceration, initial encounter   patient presenting after a fall where she  slipped in the shower hitting the left side of her head on the ceramic tile. She is complaining of headache, nausea and mild eye pain. She is neurovascularly intact distal and denies any anticoagulation use.  Only minimal left-sided paracervical tenderness with full range of motion of the neck. No numbness or tingling noted. Normal gait.  CT of the head and face pending. Small laceration over the left eyebrow repaired with Dermabond. Tetanus shot is up-to-date.  Patient given ibuprofen and Zofran.  CT negative for acute findings. Patient diagnosed with a concussion. Given Zofran to take when necessary for nausea. Wound repair with Dermabond.    Blanchie Dessert, MD 02/28/15 [phone]

## 2015-03-05 ENCOUNTER — Ambulatory Visit (HOSPITAL_BASED_OUTPATIENT_CLINIC_OR_DEPARTMENT_OTHER): Admission: RE | Admit: 2015-03-05 | Payer: 59 | Source: Ambulatory Visit

## 2015-03-05 ENCOUNTER — Ambulatory Visit (HOSPITAL_BASED_OUTPATIENT_CLINIC_OR_DEPARTMENT_OTHER)
Admission: RE | Admit: 2015-03-05 | Discharge: 2015-03-05 | Disposition: A | Discharge status: Home / Self Care | Payer: 59 | Source: Ambulatory Visit | Attending: Internal Medicine | Admitting: Internal Medicine

## 2015-03-05 ENCOUNTER — Other Ambulatory Visit (HOSPITAL_BASED_OUTPATIENT_CLINIC_OR_DEPARTMENT_OTHER): Payer: Self-pay | Admitting: Internal Medicine

## 2015-03-05 DIAGNOSIS — Z1231 Encounter for screening mammogram for malignant neoplasm of breast: Secondary | ICD-10-CM

## 2015-04-14 ENCOUNTER — Other Ambulatory Visit (HOSPITAL_BASED_OUTPATIENT_CLINIC_OR_DEPARTMENT_OTHER): Payer: Self-pay | Admitting: Internal Medicine

## 2015-04-14 ENCOUNTER — Ambulatory Visit
Admission: RE | Admit: 2015-04-14 | Discharge: 2015-04-14 | Disposition: A | Discharge status: Home / Self Care | Payer: 59 | Source: Ambulatory Visit | Attending: Internal Medicine | Admitting: Internal Medicine

## 2015-04-14 DIAGNOSIS — Z853 Personal history of malignant neoplasm of breast: Secondary | ICD-10-CM

## 2015-04-14 DIAGNOSIS — Z1231 Encounter for screening mammogram for malignant neoplasm of breast: Secondary | ICD-10-CM

## 2015-05-04 ENCOUNTER — Other Ambulatory Visit: Payer: Self-pay | Admitting: Hematology and Oncology

## 2015-05-04 NOTE — Telephone Encounter (Signed)
Chart reviewed.

## 2015-05-13 ENCOUNTER — Other Ambulatory Visit: Payer: Self-pay | Admitting: Hematology and Oncology

## 2015-06-24 ENCOUNTER — Ambulatory Visit
Admission: RE | Admit: 2015-06-24 | Discharge: 2015-06-24 | Disposition: A | Discharge status: Home / Self Care | Payer: 59 | Source: Ambulatory Visit | Attending: Internal Medicine | Admitting: Internal Medicine

## 2015-06-24 ENCOUNTER — Other Ambulatory Visit: Payer: Self-pay | Admitting: Internal Medicine

## 2015-06-24 DIAGNOSIS — R1010 Upper abdominal pain, unspecified: Secondary | ICD-10-CM

## 2015-12-11 IMAGING — MG MM SCREENING BREAST TOMO BILATERAL
4 series · 4 of 16 positions shown · non-contrast
Comparison: Previous exam(s).

CLINICAL DATA: Patient status post right breast lumpectomy 7626.

EXAM:
DIGITAL DIAGNOSTIC BILATERAL MAMMOGRAM WITH 3D TOMOSYNTHESIS AND CAD

[R MLO]
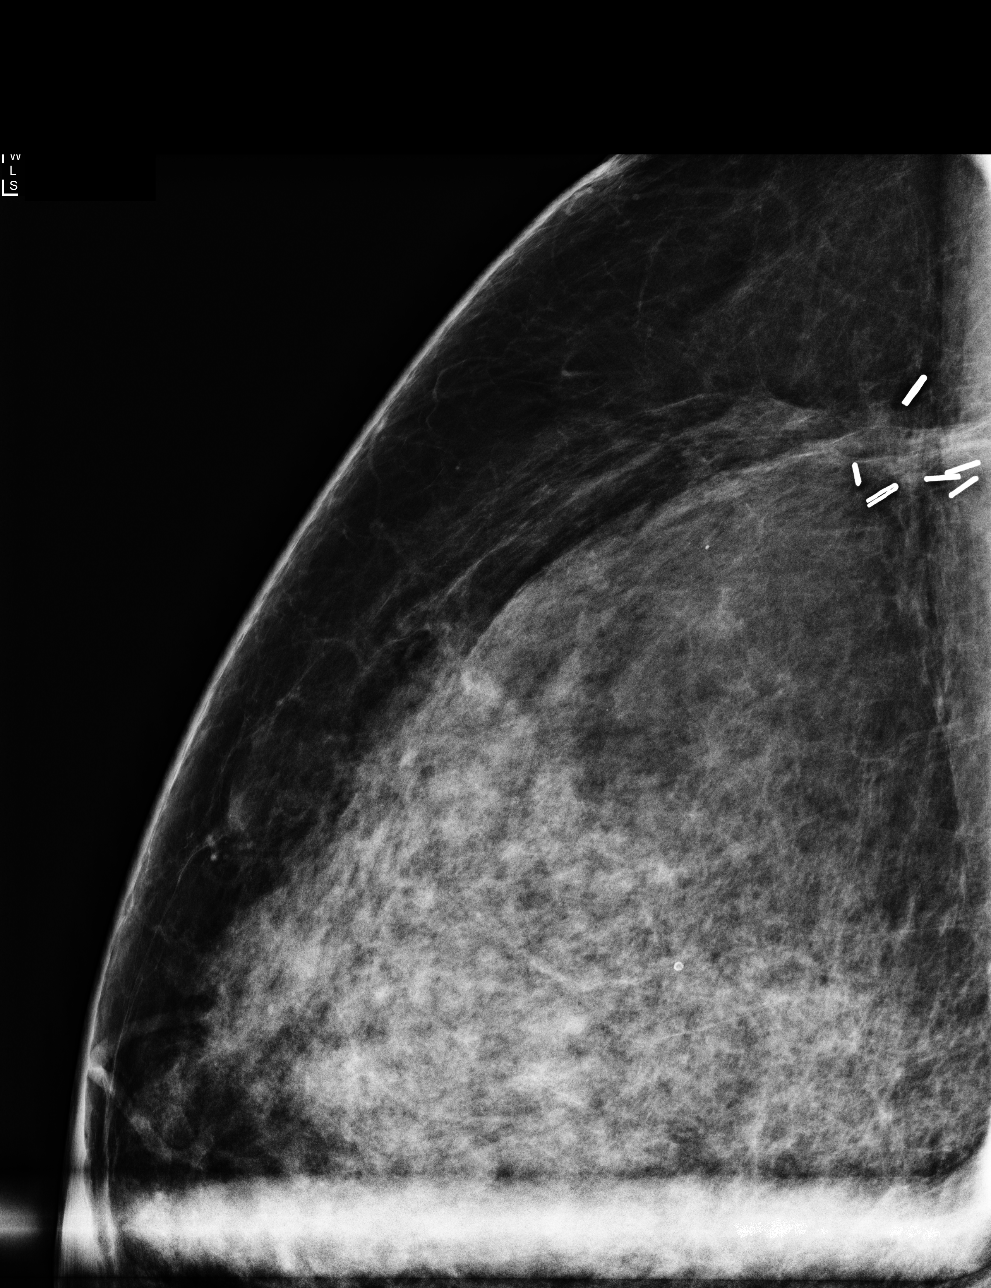

[L MLO tomo · tomo slice 40/79.0]
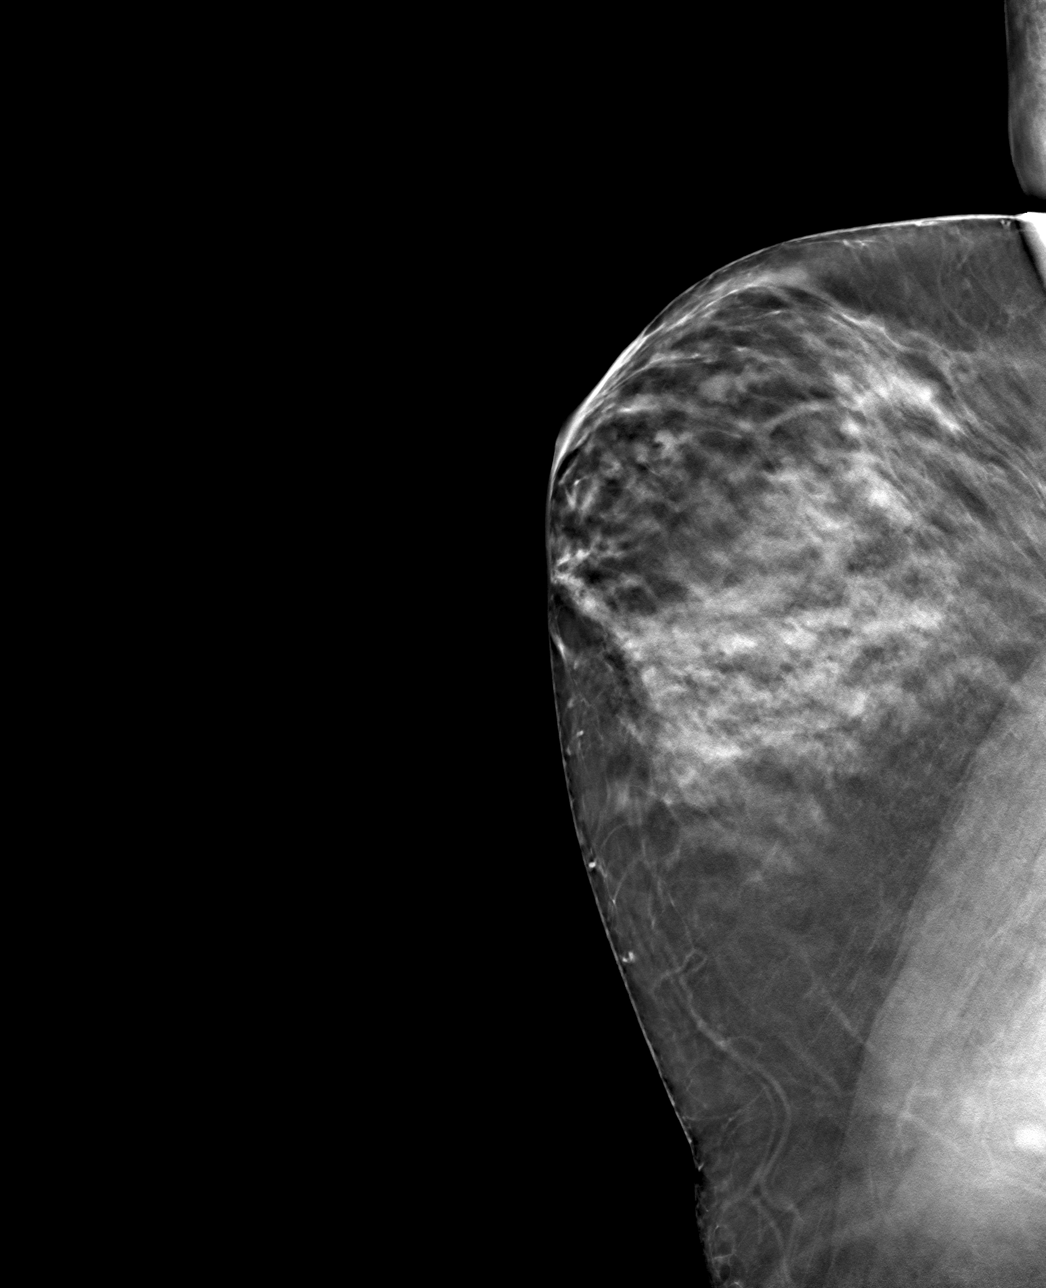

[R CC tomo (1 of 2) · tomo slice 38/75.0]
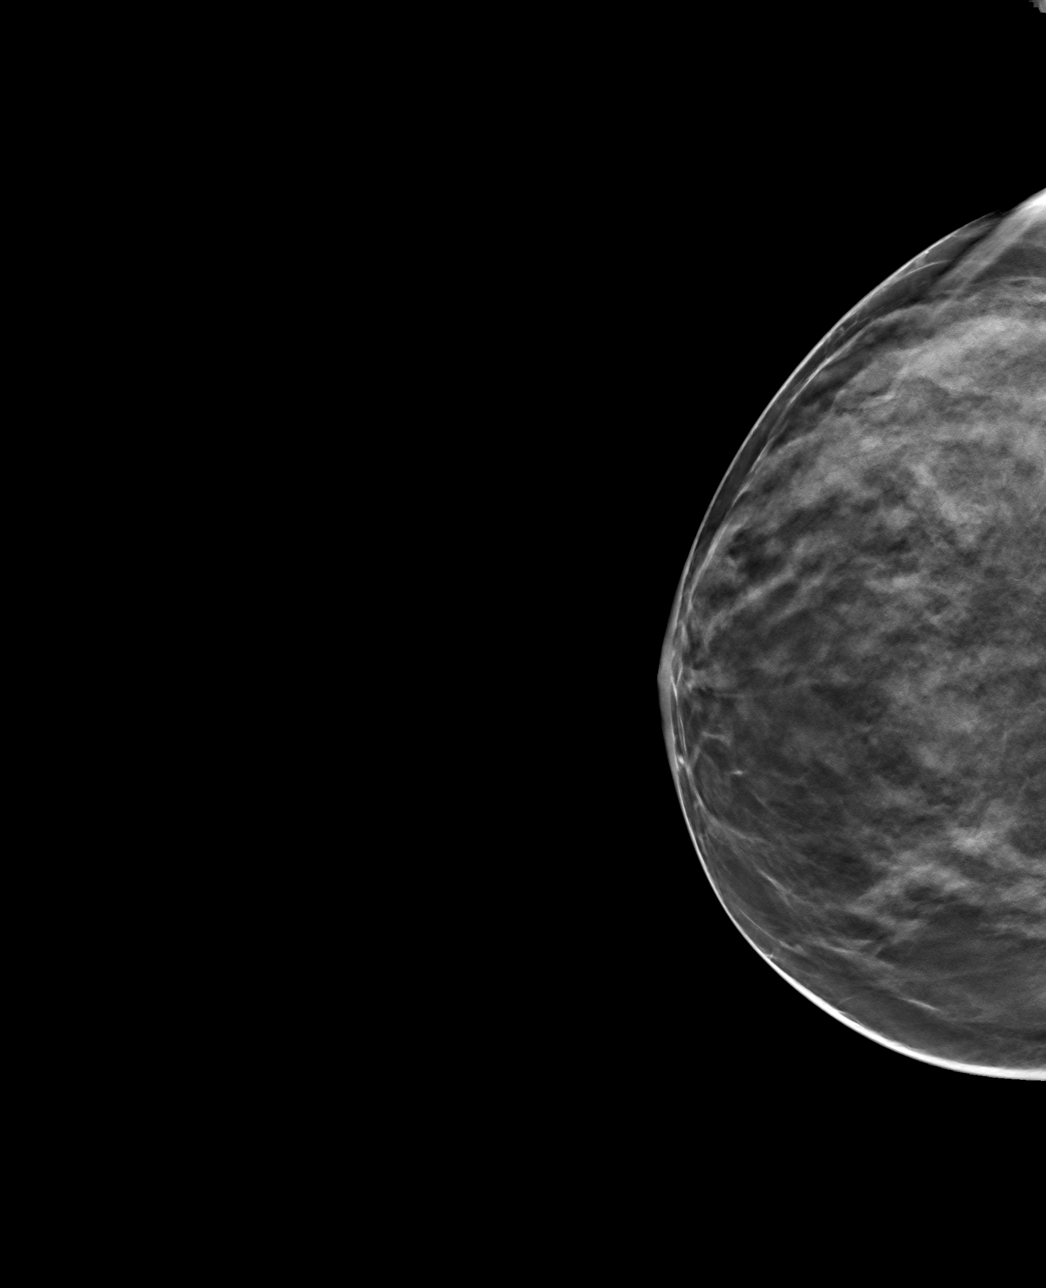

[R CC tomo (2 of 2) · tomo slice 38/75.0]
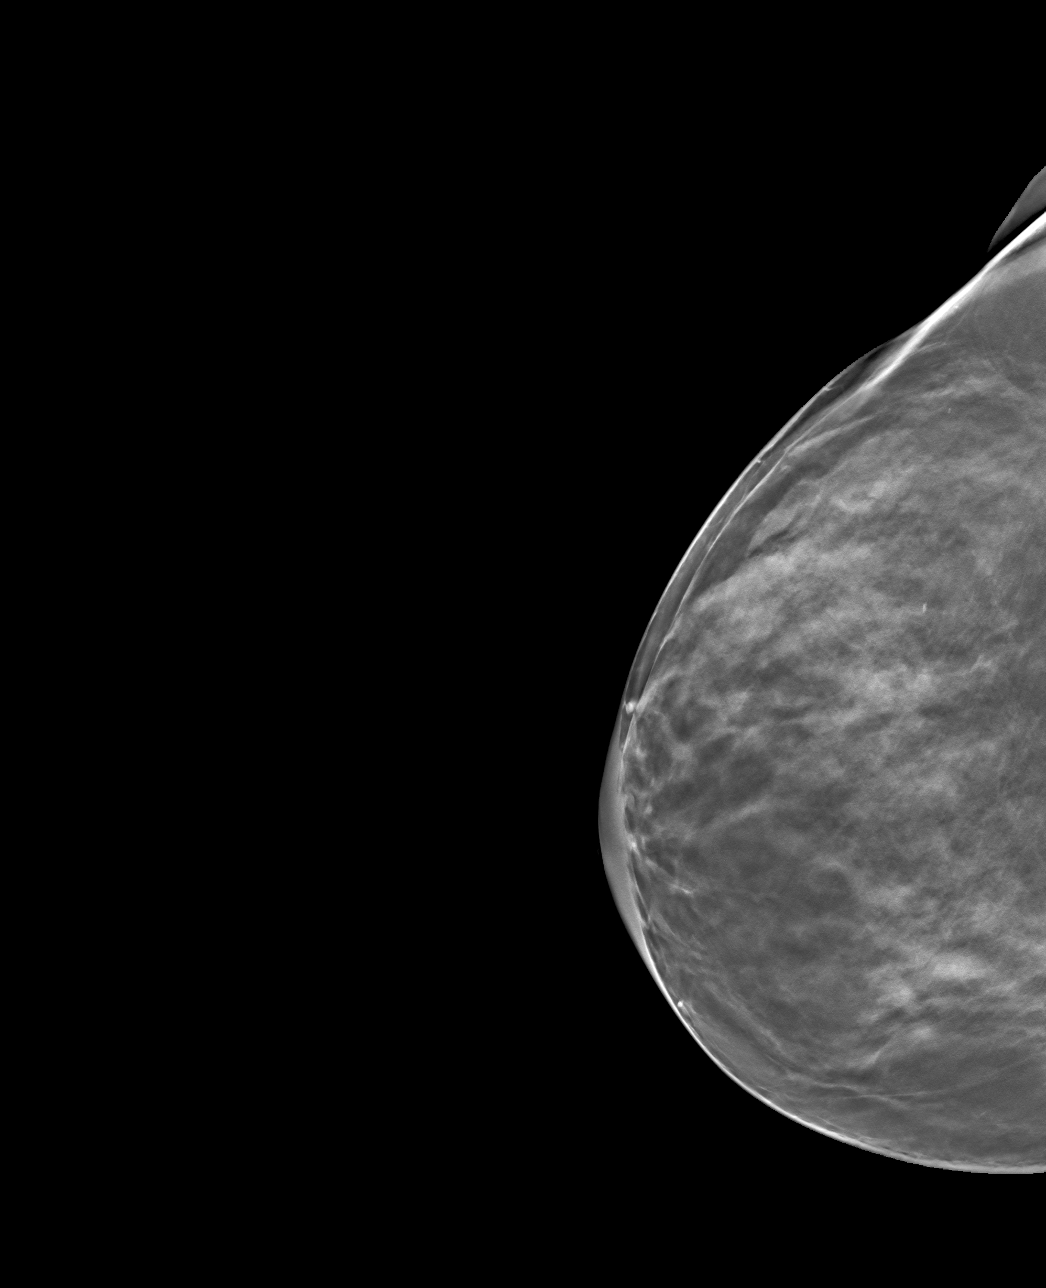

[4 of 16 positions shown; findings below may reference images not displayed]

ACR Breast Density Category c: The breast tissue is heterogeneously
dense, which may obscure small masses.
FINDINGS: Stable postlumpectomy changes right breast. No concerning masses,
calcifications or nonsurgical architectural distortion identified
within either breast.

Mammographic images were processed with CAD.
IMPRESSION: No mammographic evidence for malignancy.

Stable right breast lumpectomy changes.

RECOMMENDATION:
Bilateral diagnostic mammography 1 year.

I have discussed the findings and recommendations with the patient.
Results were also provided in writing at the conclusion of the
visit. If applicable, a reminder letter will be sent to the patient
regarding the next appointment.

BI-RADS CATEGORY  2: Benign.

## 2016-01-13 ENCOUNTER — Telehealth: Payer: Self-pay

## 2016-01-13 ENCOUNTER — Other Ambulatory Visit: Payer: Self-pay | Admitting: Internal Medicine

## 2016-01-13 DIAGNOSIS — N644 Mastodynia: Secondary | ICD-10-CM

## 2016-01-13 NOTE — Telephone Encounter (Signed)
Casey Wall with Dr Deforest Hoyles called to get an appt for pt with Dr Lindi Adie. She is having R breast soreness. No scheduler available at time of call. Casey Wall's number is [phone] if any further questions.

## 2016-01-13 NOTE — Telephone Encounter (Signed)
Received call from Juliann Pulse, Triage RN who stated pt needing appointment scheduled.  Received information from [facility] at [facility] regarding follow up in scheduling pt for new onset of right breast pain.  Upon further chart review, pt has history of right breast cancer and has diagnostic mammogram and ultrasound scheduled for 8/18.  Scheduling message sent for pt to be scheduled for follow up on Monday 8/21 with Dr. Lindi Adie to follow up from imaging.

## 2016-01-15 ENCOUNTER — Ambulatory Visit
Admission: RE | Admit: 2016-01-15 | Discharge: 2016-01-15 | Disposition: A | Discharge status: Home / Self Care | Payer: 59 | Source: Ambulatory Visit | Attending: Internal Medicine | Admitting: Internal Medicine

## 2016-01-15 DIAGNOSIS — N644 Mastodynia: Secondary | ICD-10-CM

## 2016-01-18 ENCOUNTER — Ambulatory Visit (HOSPITAL_BASED_OUTPATIENT_CLINIC_OR_DEPARTMENT_OTHER): Payer: 59 | Admitting: Hematology and Oncology

## 2016-01-18 ENCOUNTER — Encounter: Payer: Self-pay | Admitting: Hematology and Oncology

## 2016-01-18 VITALS — BP 121/67 | HR 98 | Temp 99.6°F | Resp 18 | Wt 170.8 lb

## 2016-01-18 DIAGNOSIS — Z170 Estrogen receptor positive status [ER+]: Secondary | ICD-10-CM

## 2016-01-18 DIAGNOSIS — L299 Pruritus, unspecified: Secondary | ICD-10-CM

## 2016-01-18 DIAGNOSIS — C50411 Malignant neoplasm of upper-outer quadrant of right female breast: Secondary | ICD-10-CM

## 2016-01-18 NOTE — Progress Notes (Signed)
Patient Care Team: Wenda Low, MD as PCP - General (Internal Medicine) Neldon Mc, MD (General Surgery) Eston Esters, MD (Hematology and Oncology) Arloa Koh, MD (Radiation Oncology)  DIAGNOSIS: Primary cancer of upper outer quadrant of right female breast [facility])   Staging form: Breast, AJCC 7th Edition   - Clinical: Stage IA (T1, N0, cM0) - Unsigned         Prognostic indicators: ER 100%  PR 100%  Her 2 -  Ki67=8%      - Pathologic: Stage IIA (T1, N1a, cM0) - Signed by Haywood Lasso, MD on 06/03/2011         Prognostic indicators: ER 100%  PR 100%  Her 2 -  Ki67=8%     SUMMARY OF ONCOLOGIC HISTORY:   Primary cancer of upper outer quadrant of right female breast ([place])   05/04/2011 Initial Diagnosis     invasive ductal carcinoma ER positive PR positive Ki-67 8% HER-2 negative      05/09/2011 Breast MRI     MRIs revealed solitary enhancing mass upper outer quadrant right breast with multiple subcentimeter lesions within the liver suspected to be cysts      06/01/2011 Surgery     right lumpectomy  1.7 cm low-grade IDC with lymphovascular invasion 1 /dew SLM positive with the deposit measuring 0.8 cm no extracapsular extension , Oncotype DX recurrence score 14 , 9% ROR low risk      08/12/2011 - 09/21/2011 Radiation Therapy     adjuvant radiation therapy      10/12/2011 -  Anti-estrogen oral therapy     Arimidex 1 mg daily from May 2013 to 04/28/2014 and then switch to exemestane 25 mg daily for muscle aches and pains and vaginal dryness, switched to tamoxifen 20 mg daily 05/13/2014       CHIEF COMPLIANT: Skin itching, breast tightness, easy bruising  INTERVAL HISTORY: Casey Wall is a 60 year old with above-mentioned history of right breast cancer treated with lumpectomy and adjuvant radiation and is currently on tamoxifen therapy. She is tolerating tamoxifen much better than the previous antiestrogen treatments. She however has noticed increased bruising on  her legs recently. She has not had any nosebleeds or gum bleeds. She is also having itching sensation in the skin which he hasn't had before. She had some tenderness and discomfort in the breast for which she underwent a mammogram and ultrasound both of which were normal.  REVIEW OF SYSTEMS:   Constitutional: Denies fevers, chills or abnormal weight loss Eyes: Denies blurriness of vision Ears, nose, mouth, throat, and face: Denies mucositis or sore throat Respiratory: Denies cough, dyspnea or wheezes Cardiovascular: Denies palpitation, chest discomfort Gastrointestinal:  Denies nausea, heartburn or change in bowel habits Skin: Denies abnormal skin rashes Lymphatics: Denies new lymphadenopathy or easy bruising Neurological:Denies numbness, tingling or new weaknesses Behavioral/Psych: Mood is stable, no new changes  Extremities: No lower extremity edema Breast:  denies any pain or lumps or nodules in either breasts All other systems were reviewed with the patient and are negative.  I have reviewed the past medical history, past surgical history, social history and family history with the patient and they are unchanged from previous note.  ALLERGIES:  has No Known Allergies.  MEDICATIONS:  Current Outpatient Prescriptions  Medication Sig Dispense Refill  . cetirizine (ZYRTEC) 10 MG tablet Take 10 mg by mouth daily.    . Cholecalciferol (VITAMIN D PO) Take 2,000 mg by mouth daily.     . DENTAGEL 1.1 %  GEL dental gel     . fluticasone (FLONASE) 50 MCG/ACT nasal spray Place 2 sprays into the nose as needed.     . ondansetron (ZOFRAN) 4 MG tablet Take 1 tablet (4 mg total) by mouth every 6 (six) hours. 12 tablet 0  . pantoprazole (PROTONIX) 40 MG tablet     . Probiotic Product (SOLUBLE FIBER/PROBIOTICS PO) Take 1 capsule by mouth daily.     . tamoxifen (NOLVADEX) 20 MG tablet TAKE 1 TABLET BY MOUTH ONCE DAILY 90 tablet 3  . tretinoin microspheres (RETIN-A MICRO) 0.04 % gel Apply topically at  bedtime.     Marland Kitchen UNABLE TO FIND 1 capsule 2 (two) times daily. Cleanse More OTC    . venlafaxine XR (EFFEXOR-XR) 75 MG 24 hr capsule TAKE 1 CAPSULE (75 MG TOTAL) BY MOUTH DAILY. 90 capsule 3  . vitamin E (VITAMIN E) 400 UNIT capsule Take 400 Units by mouth 3 (three) times daily.     No current facility-administered medications for this visit.     PHYSICAL EXAMINATION: ECOG PERFORMANCE STATUS: 1 - Symptomatic but completely ambulatory  Vitals:   01/18/16 1340  BP: 121/67  Pulse: 98  Resp: 18  Temp: 99.6 F (37.6 C)   Filed Weights   01/18/16 1340  Weight: 170 lb 12.8 oz (77.5 kg)    GENERAL:alert, no distress and comfortable SKIN: Easy bruising especially on the legs EYES: normal, Conjunctiva are pink and non-injected, sclera clear OROPHARYNX:no exudate, no erythema and lips, buccal mucosa, and tongue normal  NECK: supple, thyroid normal size, non-tender, without nodularity LYMPH:  no palpable lymphadenopathy in the cervical, axillary or inguinal LUNGS: clear to auscultation and percussion with normal breathing effort HEART: regular rate & rhythm and no murmurs and no lower extremity edema ABDOMEN:abdomen soft, non-tender and normal bowel sounds MUSCULOSKELETAL:no cyanosis of digits and no clubbing  NEURO: alert & oriented x 3 with fluent speech, no focal motor/sensory deficits EXTREMITIES: No lower extremity edema  LABORATORY DATA:  I have reviewed the data as listed   Chemistry      Component Value Date/Time   NA 142 02/20/2015 1048   K 4.0 02/20/2015 1048   CL 103 01/20/2012 1136   CO2 28 02/20/2015 1048   BUN 6.9 (L) 02/20/2015 1048   CREATININE 0.8 02/20/2015 1048      Component Value Date/Time   CALCIUM 9.3 02/20/2015 1048   ALKPHOS 68 02/20/2015 1048   AST 27 02/20/2015 1048   ALT 14 02/20/2015 1048   BILITOT <0.30 02/20/2015 1048       Lab Results  Component Value Date   WBC 3.5 (L) 02/20/2015   HGB 11.6 02/20/2015   HCT 35.3 02/20/2015   MCV 89.9  02/20/2015   PLT 285 02/20/2015   NEUTROABS 1.8 02/20/2015     ASSESSMENT & PLAN:  Right breast invasive ductal carcinoma ER/PR positive HER-2 negative status post lumpectomy and radiation Oncotype DX recurrence score 14, 9% ROR and started on Arimidex May 2013 with lots of muscle aches and pains , switched to exemestane November 2015 had same side effects , switched to tamoxifen 05/13/2014  Aromatase inhibitor toxicities: Severe myalgias causing her to be tearful: Discussed different remedies including Effexor or Neurontin but patient wanted to not take any more medications; Severe hot flashes Vaginal dryness : using Replens and coconut oil  Tamoxifen toxicities: Not as much myalgias but has weight gain and hot flashes.  Weight issues: Patient changed her diet and she does not eat any  more red meat. She is also trying to not eat processed foods and sugary foods. I instructed her that she needs to join a gym and get a personal trainer to help lose weight.  Breast cancer surveillance: 1. Breast exam 02/19/2015 is normal 2. Mammogram 01/15/2016 with an ultrasound is normal: Patient got this mammogram because she was experiencing some tightness and discomfort in the breast.  Skin itching: Could be related to tamoxifen. She is able to tolerate the tamoxifen treatment. I would like to obtain breast cancer index to determine if she would benefit from extended adjuvant therapy. I will call her with the results of this test. I I'm trying to say I will my brother is disoriented he had bestexercise per EMS she was in that eye vision unilateral gallbladder  Return to clinic in 1 year for follow-up.  The patient has a good understanding of the overall plan. she agrees with it. she will call with any problems that may develop before the next visit here.   Rulon Eisenmenger, MD 01/18/16

## 2016-02-03 ENCOUNTER — Telehealth: Payer: Self-pay | Admitting: *Deleted

## 2016-02-03 NOTE — Telephone Encounter (Signed)
Ordered BCI per Dr. Lindi Adie.  Faxed requisition to [facility] and informed pathology.

## 2016-02-17 ENCOUNTER — Telehealth: Payer: Self-pay | Admitting: Hematology and Oncology

## 2016-02-17 NOTE — Telephone Encounter (Signed)
Call the patient to inform her that the breast cancer index test showed that she had a very low risk of late recurrences at 1.2% and that she also has a low likelihood of benefit from extended adjuvant therapy. Once she finishes 5 years of antiestrogen therapy she will discontinue her treatment. Patient was very happy to hear the news.

## 2016-02-18 ENCOUNTER — Encounter (HOSPITAL_COMMUNITY): Payer: Self-pay

## 2016-02-18 ENCOUNTER — Telehealth: Payer: Self-pay | Admitting: *Deleted

## 2016-02-18 NOTE — Telephone Encounter (Signed)
Received Breast Cancer Index result of Low Risk.  Gave a copy to Dr. Lindi Adie, gave Valentine a copy and took a copy to HIM to scan.

## 2016-02-26 ENCOUNTER — Ambulatory Visit: Payer: 59 | Admitting: Hematology and Oncology

## 2016-04-04 ENCOUNTER — Telehealth: Payer: Self-pay

## 2016-04-04 NOTE — Telephone Encounter (Signed)
Received VM from pt requesting a call back to discuss muscle aches and pains she has been experiencing in relation to her tamoxifen.  Called pt back to obtain more details and discuss further.  Pt reports she has been having an increase in muscle aches and pains to her joints which she was wondering if these were caused by tamoxifen. Pt reports she is scheduled to stop taking tamoxifen this December and thus stopped taking medication last Tuesday because of her concerns with muscle and joint aches.  I informed pt that usually Dr. Lindi Adie recommends pt discontinue medication for 2-3 weeks to see if symptoms improve and my recommendation is for her to hold for 2 more weeks and then call us back to let us know how she is feeling.  At this point, Dr. Lindi Adie can decide if pt needs to change medication or discontinue all together being 1 month short.  I also instructed pt if muscle and joint aches do not seem to resolve after discontinuing medication or if symptoms worsen, she should see her PCP for evaluation.  Pt verbalized understanding and is without further questions or concerns at time of call.

## 2016-04-14 ENCOUNTER — Other Ambulatory Visit: Payer: Self-pay | Admitting: Internal Medicine

## 2016-04-14 DIAGNOSIS — Z853 Personal history of malignant neoplasm of breast: Secondary | ICD-10-CM

## 2016-05-06 ENCOUNTER — Ambulatory Visit
Admission: RE | Admit: 2016-05-06 | Discharge: 2016-05-06 | Disposition: A | Discharge status: Home / Self Care | Payer: 59 | Source: Ambulatory Visit | Attending: Internal Medicine | Admitting: Internal Medicine

## 2016-05-06 DIAGNOSIS — Z853 Personal history of malignant neoplasm of breast: Secondary | ICD-10-CM

## 2016-05-21 ENCOUNTER — Other Ambulatory Visit: Payer: Self-pay | Admitting: Hematology and Oncology

## 2016-09-12 DIAGNOSIS — Z0100 Encounter for examination of eyes and vision without abnormal findings: Secondary | ICD-10-CM | POA: Diagnosis not present

## 2017-01-17 ENCOUNTER — Other Ambulatory Visit: Payer: Self-pay | Admitting: Internal Medicine

## 2017-01-17 ENCOUNTER — Ambulatory Visit
Admission: RE | Admit: 2017-01-17 | Discharge: 2017-01-17 | Disposition: A | Discharge status: Home / Self Care | Payer: 59 | Source: Ambulatory Visit | Attending: Internal Medicine | Admitting: Internal Medicine

## 2017-01-17 ENCOUNTER — Ambulatory Visit (HOSPITAL_BASED_OUTPATIENT_CLINIC_OR_DEPARTMENT_OTHER): Payer: 59 | Admitting: Hematology and Oncology

## 2017-01-17 ENCOUNTER — Encounter: Payer: Self-pay | Admitting: Hematology and Oncology

## 2017-01-17 DIAGNOSIS — C50411 Malignant neoplasm of upper-outer quadrant of right female breast: Secondary | ICD-10-CM

## 2017-01-17 DIAGNOSIS — Z170 Estrogen receptor positive status [ER+]: Secondary | ICD-10-CM

## 2017-01-17 DIAGNOSIS — R7303 Prediabetes: Secondary | ICD-10-CM | POA: Diagnosis not present

## 2017-01-17 DIAGNOSIS — R059 Cough, unspecified: Secondary | ICD-10-CM

## 2017-01-17 DIAGNOSIS — Z0000 Encounter for general adult medical examination without abnormal findings: Secondary | ICD-10-CM | POA: Diagnosis not present

## 2017-01-17 DIAGNOSIS — R05 Cough: Secondary | ICD-10-CM | POA: Diagnosis not present

## 2017-01-17 DIAGNOSIS — J309 Allergic rhinitis, unspecified: Secondary | ICD-10-CM | POA: Diagnosis not present

## 2017-01-17 DIAGNOSIS — Z23 Encounter for immunization: Secondary | ICD-10-CM | POA: Diagnosis not present

## 2017-01-17 DIAGNOSIS — Z1389 Encounter for screening for other disorder: Secondary | ICD-10-CM | POA: Diagnosis not present

## 2017-01-17 DIAGNOSIS — K279 Peptic ulcer, site unspecified, unspecified as acute or chronic, without hemorrhage or perforation: Secondary | ICD-10-CM | POA: Diagnosis not present

## 2017-01-17 DIAGNOSIS — E559 Vitamin D deficiency, unspecified: Secondary | ICD-10-CM | POA: Diagnosis not present

## 2017-01-17 NOTE — Progress Notes (Signed)
Patient Care Team: Wenda Low, MD as PCP - General (Internal Medicine) Neldon Mc, MD (General Surgery) Eston Esters, MD (Inactive) (Hematology and Oncology) Arloa Koh, MD (Radiation Oncology)  DIAGNOSIS:  Encounter Diagnosis  Name Primary?  . Primary cancer of upper outer quadrant of right female breast ([place])     SUMMARY OF ONCOLOGIC HISTORY:   Primary cancer of upper outer quadrant of right female breast ([place])   05/04/2011 Initial Diagnosis     invasive ductal carcinoma ER positive PR positive Ki-67 8% HER-2 negative      05/09/2011 Breast MRI     MRIs revealed solitary enhancing mass upper outer quadrant right breast with multiple subcentimeter lesions within the liver suspected to be cysts      06/01/2011 Surgery     right lumpectomy  1.7 cm low-grade IDC with lymphovascular invasion 1 /dew SLM positive with the deposit measuring 0.8 cm no extracapsular extension , Oncotype DX recurrence score 14 , 9% ROR low risk      08/12/2011 - 09/21/2011 Radiation Therapy     adjuvant radiation therapy      10/12/2011 - 06/19/2016 Anti-estrogen oral therapy     Arimidex 1 mg daily from May 2013 to 04/28/2014 and then switch to exemestane 25 mg daily for muscle aches and pains and vaginal dryness, switched to tamoxifen 20 mg daily 05/13/2014       CHIEF COMPLIANT:  Follow-up after discontinuing tamoxifen therapy  INTERVAL HISTORY: Casey Wall is a  61 year old with above-mentioned history of right breast cancer treated with lumpectomy radiation and is currently on surveillance. She could not tolerate tamoxifen because of severe hot flashes and mood swings. She finally stopped taking it in January 2018. She has had mammograms which have been normal. She has intermittent discomfort along the scar tissue of the breast.  REVIEW OF SYSTEMS:   Constitutional: Denies fevers, chills or abnormal weight loss Eyes: Denies blurriness of vision Ears, nose, mouth, throat, and  face: Denies mucositis or sore throat Respiratory: Denies cough, dyspnea or wheezes Cardiovascular: Denies palpitation, chest discomfort Gastrointestinal:  Denies nausea, heartburn or change in bowel habits Skin: Denies abnormal skin rashes Lymphatics: Denies new lymphadenopathy or easy bruising Neurological:Denies numbness, tingling or new weaknesses Behavioral/Psych: Mood is stable, no new changes  Extremities: No lower extremity edema Breast:  denies any pain or lumps or nodules in either breasts All other systems were reviewed with the patient and are negative.  I have reviewed the past medical history, past surgical history, social history and family history with the patient and they are unchanged from previous note.  ALLERGIES:  has No Known Allergies.  MEDICATIONS:  Current Outpatient Prescriptions  Medication Sig Dispense Refill  . cetirizine (ZYRTEC) 10 MG tablet Take 10 mg by mouth daily.    . Cholecalciferol (VITAMIN D PO) Take 2,000 mg by mouth daily.     . fluticasone (FLONASE) 50 MCG/ACT nasal spray Place 2 sprays into the nose as needed.     . pantoprazole (PROTONIX) 40 MG tablet     . Probiotic Product (SOLUBLE FIBER/PROBIOTICS PO) Take 1 capsule by mouth daily.     . tamoxifen (NOLVADEX) 20 MG tablet TAKE 1 TABLET BY MOUTH DAILY 90 tablet 3  . tretinoin microspheres (RETIN-A MICRO) 0.04 % gel Apply topically at bedtime.     Marland Kitchen UNABLE TO FIND 1 capsule 2 (two) times daily. Cleanse More OTC    . venlafaxine XR (EFFEXOR-XR) 75 MG 24 hr capsule TAKE 1 CAPSULE (  75 MG TOTAL) BY MOUTH DAILY. 90 capsule 3   No current facility-administered medications for this visit.     PHYSICAL EXAMINATION: ECOG PERFORMANCE STATUS: 1 - Symptomatic but completely ambulatory  Vitals:   01/17/17 1412  BP: 127/62  Pulse: 80  Resp: 18  Temp: 98.3 F (36.8 C)  SpO2: 100%   Filed Weights   01/17/17 1412  Weight: 167 lb 8 oz (76 kg)    GENERAL:alert, no distress and  comfortable SKIN: skin color, texture, turgor are normal, no rashes or significant lesions EYES: normal, Conjunctiva are pink and non-injected, sclera clear OROPHARYNX:no exudate, no erythema and lips, buccal mucosa, and tongue normal  NECK: supple, thyroid normal size, non-tender, without nodularity LYMPH:  no palpable lymphadenopathy in the cervical, axillary or inguinal LUNGS: clear to auscultation and percussion with normal breathing effort HEART: regular rate & rhythm and no murmurs and no lower extremity edema ABDOMEN:abdomen soft, non-tender and normal bowel sounds MUSCULOSKELETAL:no cyanosis of digits and no clubbing  NEURO: alert & oriented x 3 with fluent speech, no focal motor/sensory deficits EXTREMITIES: No lower extremity edema BREAST: No palpable masses or nodules in either right or left breasts. No palpable axillary supraclavicular or infraclavicular adenopathy no breast tenderness or nipple discharge. (exam performed in the presence of a chaperone)  LABORATORY DATA:  I have reviewed the data as listed   Chemistry      Component Value Date/Time   NA 142 02/20/2015 1048   K 4.0 02/20/2015 1048   CL 103 01/20/2012 1136   CO2 28 02/20/2015 1048   BUN 6.9 (L) 02/20/2015 1048   CREATININE 0.8 02/20/2015 1048      Component Value Date/Time   CALCIUM 9.3 02/20/2015 1048   ALKPHOS 68 02/20/2015 1048   AST 27 02/20/2015 1048   ALT 14 02/20/2015 1048   BILITOT <0.30 02/20/2015 1048       Lab Results  Component Value Date   WBC 3.5 (L) 02/20/2015   HGB 11.6 02/20/2015   HCT 35.3 02/20/2015   MCV 89.9 02/20/2015   PLT 285 02/20/2015   NEUTROABS 1.8 02/20/2015    ASSESSMENT & PLAN:  Primary cancer of upper outer quadrant of right female breast Right breast invasive ductal carcinoma ER/PR positive HER-2 negative status post lumpectomy and radiation Oncotype DX recurrence score 14, 9% ROR and started on Arimidex May 2013 with lots of muscle aches and pains , switched  to exemestane November 2015 had same side effects , switched to tamoxifen 05/13/2014  Aromatase inhibitor toxicities: Severe myalgias causing her to be tearful: Discussed different remedies including Effexor or Neurontin but patient wanted to not take any more medications; Severe hot flashes Vaginal dryness : using Replens and coconut oil  Tamoxifen toxicities: Not as much myalgias but has weight gain and hot flashes. Stopped early because of mood swings and hot flashes  Weight issues: Patient changed her diet and she does not eat any more red meat. She is also trying to not eat processed foods and sugary foods. I instructed her that she needs to join a gym and get a personal trainer to help lose weight.  Breast cancer surveillance: 1. Breast exam 01/17/2017 is normal 2. Mammogram 05/06/2016 is normal, breast density category C  patient does not wish to follow with [place] for surveillance. She will follow with her primary care physician.  I spent 15 minutes talking to the patient of which more than half was spent in counseling and coordination of care.  No  orders of the defined types were placed in this encounter.  The patient has a good understanding of the overall plan. she agrees with it. she will call with any problems that may develop before the next visit here.   Rulon Eisenmenger, MD 01/17/17

## 2017-01-17 NOTE — Assessment & Plan Note (Signed)
Right breast invasive ductal carcinoma ER/PR positive HER-2 negative status post lumpectomy and radiation Oncotype DX recurrence score 14, 9% ROR and started on Arimidex May 2013 with lots of muscle aches and pains , switched to exemestane November 2015 had same side effects , switched to tamoxifen 05/13/2014  Aromatase inhibitor toxicities: Severe myalgias causing her to be tearful: Discussed different remedies including Effexor or Neurontin but patient wanted to not take any more medications; Severe hot flashes Vaginal dryness : using Replens and coconut oil  Tamoxifen toxicities: Not as much myalgias but has weight gain and hot flashes. Leukopenia primarily neutropenia:   Weight issues: Patient changed her diet and she does not eat any more red meat. She is also trying to not eat processed foods and sugary foods. I instructed her that she needs to join a gym and get a personal trainer to help lose weight.  Breast cancer surveillance: 1. Breast exam 01/17/2017 is normal 2. Mammogram 05/06/2016 is normal, breast density category C  Return to clinic in one-year for follow-up.

## 2017-01-24 ENCOUNTER — Encounter (HOSPITAL_BASED_OUTPATIENT_CLINIC_OR_DEPARTMENT_OTHER): Payer: Self-pay | Admitting: Emergency Medicine

## 2017-01-24 ENCOUNTER — Emergency Department (HOSPITAL_BASED_OUTPATIENT_CLINIC_OR_DEPARTMENT_OTHER)
Admission: EM | Admit: 2017-01-24 | Discharge: 2017-01-24 | Disposition: A | Discharge status: Home / Self Care | Payer: 59 | Attending: Emergency Medicine | Admitting: Emergency Medicine

## 2017-01-24 ENCOUNTER — Emergency Department (HOSPITAL_BASED_OUTPATIENT_CLINIC_OR_DEPARTMENT_OTHER): Payer: 59

## 2017-01-24 DIAGNOSIS — Y929 Unspecified place or not applicable: Secondary | ICD-10-CM | POA: Diagnosis not present

## 2017-01-24 DIAGNOSIS — Z853 Personal history of malignant neoplasm of breast: Secondary | ICD-10-CM | POA: Diagnosis not present

## 2017-01-24 DIAGNOSIS — Y939 Activity, unspecified: Secondary | ICD-10-CM | POA: Diagnosis not present

## 2017-01-24 DIAGNOSIS — Y999 Unspecified external cause status: Secondary | ICD-10-CM | POA: Insufficient documentation

## 2017-01-24 DIAGNOSIS — R198 Other specified symptoms and signs involving the digestive system and abdomen: Secondary | ICD-10-CM

## 2017-01-24 DIAGNOSIS — F458 Other somatoform disorders: Secondary | ICD-10-CM | POA: Insufficient documentation

## 2017-01-24 DIAGNOSIS — X58XXXA Exposure to other specified factors, initial encounter: Secondary | ICD-10-CM | POA: Diagnosis not present

## 2017-01-24 DIAGNOSIS — R0989 Other specified symptoms and signs involving the circulatory and respiratory systems: Secondary | ICD-10-CM

## 2017-01-24 DIAGNOSIS — Z79899 Other long term (current) drug therapy: Secondary | ICD-10-CM | POA: Diagnosis not present

## 2017-01-24 DIAGNOSIS — T189XXA Foreign body of alimentary tract, part unspecified, initial encounter: Secondary | ICD-10-CM | POA: Diagnosis present

## 2017-01-24 MED ORDER — GI COCKTAIL ~~LOC~~
30.0000 mL | Freq: Once | ORAL | Status: AC
  Administered 2017-01-24: 30 mL via ORAL
  Filled 2017-01-24: qty 30

## 2017-01-24 NOTE — ED Provider Notes (Signed)
[place] DEPT [facility] Provider Note   CSN: [id number] Arrival date & time: 01/24/17  6503     History   Chief Complaint Chief Complaint  Patient presents with  . Swallowed Foreign Body    HPI Casey Wall is a 61 y.o. female history of breast cancer, reflux here presenting after she swallowed a foreign oh wrapper. She states that she was taking her Nexium this morning and not realized there is a formal wrap around it and accidentally swallowed it. She felt that it was stuck in her throat so took some bread and able to swallow it but she still has scratchy feeling in her throat. Denies any cough or trouble breathing or vomiting.    The history is provided by the patient.    Past Medical History:  Diagnosis Date  . Anemia   . Breast cancer ([place]) 04/2011   right  . Cancer of upper-outer quadrant of female breast ([place]) 05/10/2011  . Constipation   . GERD (gastroesophageal reflux disease) 2012  . History of fibrocystic disease of breast   . Hot flashes related to aromatase inhibitor therapy    Arimidex  . Seasonal allergies   . Status post radiation therapy 08/08/11 - 09/21/11   Right Breast: 50.4 Gy/28 Fractions: Boost 10 Gy/ 5 Fractions  . Stomach ulcer 2012    Patient Active Problem List   Diagnosis Date Noted  . Primary cancer of upper outer quadrant of right female breast ([place]) 05/10/2011    Past Surgical History:  Procedure Laterality Date  . breast cyst aspirated    . BREAST LUMPECTOMY  06/01/11   right  . CERVICAL DISCECTOMY  2007  . CESAREAN SECTION  1980  . herniated disc surgery  2007   neck  . TOTAL ABDOMINAL HYSTERECTOMY  1997    OB History    No data available       Home Medications    Prior to Admission medications   Medication Sig Start Date End Date Taking? Authorizing Provider  cetirizine (ZYRTEC) 10 MG tablet Take 10 mg by mouth daily.    [provider]  Cholecalciferol (VITAMIN D PO) Take 2,000 mg by mouth daily.      [provider]  fluticasone (FLONASE) 50 MCG/ACT nasal spray Place 2 sprays into the nose as needed.     [provider]  pantoprazole (PROTONIX) 40 MG tablet  09/13/12   [provider]  Probiotic Product (SOLUBLE FIBER/PROBIOTICS PO) Take 1 capsule by mouth daily.     [provider]  tamoxifen (NOLVADEX) 20 MG tablet TAKE 1 TABLET BY MOUTH DAILY 05/24/16   Nicholas Lose, MD  tretinoin microspheres (RETIN-A MICRO) 0.04 % gel Apply topically at bedtime.     [provider]  UNABLE TO FIND 1 capsule 2 (two) times daily. Cleanse More OTC    [provider]  venlafaxine XR (EFFEXOR-XR) 75 MG 24 hr capsule TAKE 1 CAPSULE (75 MG TOTAL) BY MOUTH DAILY. 05/13/15   Nicholas Lose, MD    Family History Family History  Problem Relation Age of Onset  . Lung cancer Father   . Cancer Father        lung  . Diabetes Mother   . Cancer Other     Social History Social History  Substance Use Topics  . Smoking status: Never Smoker  . Smokeless tobacco: Never Used  . Alcohol use Yes     Comment: occasionally     Allergies   Patient  has no known allergies.   Review of Systems Review of Systems  HENT: Positive for sore throat.   All other systems reviewed and are negative.    Physical Exam Updated Vital Signs BP 119/68 (BP Location: Left Arm)   Pulse 86   Temp 98.2 F (36.8 C) (Oral)   Resp 14   Ht 5\' 6"  (1.676 m)   Wt 70.8 kg (156 lb)   SpO2 100%   BMI 25.18 kg/m   Physical Exam  Constitutional: She is oriented to person, place, and time. She appears well-developed.  Slightly anxious   HENT:  Head: Normocephalic.  No obvious foreign body in posterior pharynx   Eyes: Pupils are equal, round, and reactive to light.  Neck: Normal range of motion. Neck supple.  No stridor.   Cardiovascular: Normal rate, regular rhythm and normal heart sounds.   Pulmonary/Chest: Effort normal and breath sounds normal. No respiratory distress.  She has no wheezes.  Abdominal: Soft. Bowel sounds are normal. She exhibits no distension. There is no tenderness. There is no guarding.  Musculoskeletal: Normal range of motion.  Neurological: She is alert and oriented to person, place, and time. No cranial nerve deficit. Coordination normal.  Skin: Skin is warm.  Psychiatric: She has a normal mood and affect.  Nursing note and vitals reviewed.    ED Treatments / Results  Labs (all labs ordered are listed, but only abnormal results are displayed) Labs Reviewed - No data to display  EKG  EKG Interpretation None       Radiology Dg Neck Soft Tissue  Result Date: 01/24/2017 CLINICAL DATA:  Pt swallowing piece of tin foil from the packaging with her pill this am. Scratchy throat. EXAM: NECK SOFT TISSUES - 1+ VIEW COMPARISON:  None. FINDINGS: There is no evidence of retropharyngeal soft tissue swelling or epiglottic enlargement. The cervical airway is unremarkable and no radio-opaque foreign body identified. Anterior cervical fusion at C3-4 without hardware failure or complication. Degenerative disc disease with disc height loss at C4-5, C5-6 and C6-7. IMPRESSION: No radiopaque foreign body in the neck soft tissues. Electronically Signed   By: Kathreen Devoid   On: 01/24/2017 09:28    Procedures Procedures (including critical care time)  Medications Ordered in ED Medications  gi cocktail (Maalox,Lidocaine,Donnatal) (30 mLs Oral Given 01/24/17 2979)     Initial Impression / Assessment and Plan / ED Course  I have reviewed the triage vital signs and the nursing notes.  Pertinent labs & imaging results that were available during my care of the patient were reviewed by me and considered in my medical decision making (see chart for details).     Casey Wall is a 61 y.o. female here with sore throat after possible tin foil ingestion. I think likely foreign body sensation. Foil may show up on xray so will get neck xray. I think likely  its in her stomach already. Lungs clear, I doubt aspiration into her lungs. Will give GI cocktail for comfort, get neck xray.   9:45 AM Patient felt better with GI cocktail. Xray showed no obvious foreign body. Will dc home.    Final Clinical Impressions(s) / ED Diagnoses   Final diagnoses:  None    New Prescriptions New Prescriptions   No medications on file     Drenda Freeze, MD 01/24/17 [phone]

## 2017-01-24 NOTE — Discharge Instructions (Signed)
The foil is likely in your stomach. You may notice it in your stool.   Be careful when you take your medicines in the future.   See your doctor  Return to ER if you have trouble swallowing, persistent sore throat, fever, cough, vomiting

## 2017-01-24 NOTE — ED Triage Notes (Signed)
Pt states she swallowed part of a the foil wrapper that her nexium was in. No distress, speaking clear sentences.

## 2017-01-24 NOTE — ED Notes (Signed)
Patient transported to X-ray 

## 2017-03-14 ENCOUNTER — Encounter: Payer: Self-pay | Admitting: Hematology and Oncology

## 2017-04-07 ENCOUNTER — Other Ambulatory Visit: Payer: Self-pay | Admitting: Internal Medicine

## 2017-04-07 DIAGNOSIS — Z853 Personal history of malignant neoplasm of breast: Secondary | ICD-10-CM

## 2017-05-31 ENCOUNTER — Ambulatory Visit
Admission: RE | Admit: 2017-05-31 | Discharge: 2017-05-31 | Disposition: A | Discharge status: Home / Self Care | Payer: 59 | Source: Ambulatory Visit | Attending: Internal Medicine | Admitting: Internal Medicine

## 2017-05-31 DIAGNOSIS — R922 Inconclusive mammogram: Secondary | ICD-10-CM | POA: Diagnosis not present

## 2017-05-31 DIAGNOSIS — Z853 Personal history of malignant neoplasm of breast: Secondary | ICD-10-CM

## 2017-05-31 HISTORY — DX: Personal history of irradiation: Z92.3

## 2017-06-22 ENCOUNTER — Encounter: Payer: Self-pay | Admitting: Podiatry

## 2017-06-22 ENCOUNTER — Ambulatory Visit: Payer: 59 | Admitting: Podiatry

## 2017-06-22 VITALS — BP 109/71 | HR 90 | Ht 66.0 in | Wt 165.0 lb

## 2017-06-22 DIAGNOSIS — L603 Nail dystrophy: Secondary | ICD-10-CM | POA: Diagnosis not present

## 2017-06-22 DIAGNOSIS — B351 Tinea unguium: Secondary | ICD-10-CM | POA: Diagnosis not present

## 2017-06-22 NOTE — Patient Instructions (Signed)

## 2017-06-22 NOTE — Progress Notes (Signed)
Subjective:  Patient ID: Casey Wall, female    DOB: [birth year],  MRN: [id number]  Chief Complaint  Patient presents with  . Nail Problem    (NP) toenail is coming off/right great toenail   62 y.o. female presents with and ingrown toenail to the right great toenail.  Reports that the nail is coming off and is causing.  Past Medical History:  Diagnosis Date  . Anemia   . Breast cancer ([place]) 04/2011   right  . Cancer of upper-outer quadrant of female breast ([place]) 05/10/2011  . Constipation   . GERD (gastroesophageal reflux disease) 2012  . History of fibrocystic disease of breast   . Hot flashes related to aromatase inhibitor therapy    Arimidex  . Personal history of radiation therapy   . Seasonal allergies   . Status post radiation therapy 08/08/11 - 09/21/11   Right Breast: 50.4 Gy/28 Fractions: Boost 10 Gy/ 5 Fractions  . Stomach ulcer 2012   Past Surgical History:  Procedure Laterality Date  . BREAST BIOPSY    . breast cyst aspirated    . BREAST LUMPECTOMY  06/01/11   right  . CERVICAL DISCECTOMY  2007  . CESAREAN SECTION  1980  . herniated disc surgery  2007   neck  . TOTAL ABDOMINAL HYSTERECTOMY  1997    Current Outpatient Medications:  .  cetirizine (ZYRTEC) 10 MG tablet, Take 10 mg by mouth daily., Disp: , Rfl:  .  Cholecalciferol (VITAMIN D PO), Take 2,000 mg by mouth daily. , Disp: , Rfl:  .  fluticasone (FLONASE) 50 MCG/ACT nasal spray, Place 2 sprays into the nose as needed. , Disp: , Rfl:  .  pantoprazole (PROTONIX) 40 MG tablet, , Disp: , Rfl:  .  Probiotic Product (SOLUBLE FIBER/PROBIOTICS PO), Take 1 capsule by mouth daily. , Disp: , Rfl:  .  tamoxifen (NOLVADEX) 20 MG tablet, TAKE 1 TABLET BY MOUTH DAILY, Disp: 90 tablet, Rfl: 3 .  tretinoin microspheres (RETIN-A MICRO) 0.04 % gel, Apply topically at bedtime. , Disp: , Rfl:  .  UNABLE TO FIND, 1 capsule 2 (two) times daily. Cleanse More OTC, Disp: , Rfl:  .  venlafaxine XR (EFFEXOR-XR) 75 MG 24 hr  capsule, TAKE 1 CAPSULE (75 MG TOTAL) BY MOUTH DAILY., Disp: 90 capsule, Rfl: 3  Allergies  Allergen Reactions  . Vaccinium Angustifolium Itching    TINGLING AND ITCHING   Review of Systems all systems reviewed and negative except as noted in HPI Objective:   Vitals:   06/22/17 0912  BP: 109/71  Pulse: 90   General AA&O x3. Normal mood and affect.  Vascular Dorsalis pedis and posterior tibial pulses  present 2+ bilaterally  Capillary refill normal to all digits. Pedal hair growth normal.  Neurologic Epicritic sensation grossly present.  Dermatologic No open lesions. Interspaces clear of maceration. Nails well groomed and normal in appearance. Right hallux nail with pain to palpation loosening.  Orthopedic: MMT 5/5 in dorsiflexion, plantarflexion, inversion, and eversion. Normal joint ROM without pain or crepitus. Pain to palpation about the ingrown nail.   Assessment & Plan:  Patient was evaluated and treated and all questions answered.  Right hallux onycholysis -Patient elects to proceed with toenail removal today. See procedure note. -Educated on post-procedure care including soaking. Written instructions provided. -Patient to follow up in 2 weeks for nail check. -Nail sent for culture and histology  Procedure: Excision of Ingrown Toenail Location: Right 1st toenail. Anesthesia: Lidocaine 1% plain; 1.5 mL and  Marcaine 0.5% plain; 1.5 mL, digital block. Skin Prep: Betadine. Dressing: Silvadene; telfa; dry, sterile, compression dressing. Technique: Following skin prep, the toe was exsanguinated and a tourniquet was secured at the base of the toe.  The affected nail was freed with a Freer and avulsed with a hemostat.  The area was cleansed, the tourniquet was then removed and sterile dressing applied. Disposition: Patient tolerated procedure well. Patient to return in 2 weeks for follow-up.   Return in about 2 weeks (around 07/06/2017).

## 2017-07-06 ENCOUNTER — Ambulatory Visit (INDEPENDENT_AMBULATORY_CARE_PROVIDER_SITE_OTHER): Payer: 59 | Admitting: Podiatry

## 2017-07-06 ENCOUNTER — Encounter: Payer: Self-pay | Admitting: Podiatry

## 2017-07-06 DIAGNOSIS — B351 Tinea unguium: Secondary | ICD-10-CM | POA: Diagnosis not present

## 2017-07-06 NOTE — Progress Notes (Signed)
  Subjective:  Patient ID: Casey Wall, female    DOB: 1956/03/10,  MRN: [id number]  No chief complaint on file.  62 y.o. female returns for f/u R great toenail avulsion. Had some pain after procedure but no complaints today.  Objective:   General AA&O x3. Normal mood and affect.  Vascular Foot warm and well perfused with good capillary refill.  Neurologic Sensation grossly intact.  Dermatologic Nail avulsion site healing well without drainage or erythema. Nail bed with overlying soft crust. Left intact. No signs of local infection.  Orthopedic: No tenderness to palpation of the toe.   Assessment & Plan:  Patient was evaluated and treated and all questions answered.  S/p Toenail Avulsion, Right -Healing well without issue. -Discussed return precautions. -F/u PRN

## 2018-01-18 DIAGNOSIS — C50011 Malignant neoplasm of nipple and areola, right female breast: Secondary | ICD-10-CM | POA: Diagnosis not present

## 2018-01-18 DIAGNOSIS — K219 Gastro-esophageal reflux disease without esophagitis: Secondary | ICD-10-CM | POA: Diagnosis not present

## 2018-01-18 DIAGNOSIS — Z0000 Encounter for general adult medical examination without abnormal findings: Secondary | ICD-10-CM | POA: Diagnosis not present

## 2018-01-18 DIAGNOSIS — Z1389 Encounter for screening for other disorder: Secondary | ICD-10-CM | POA: Diagnosis not present

## 2018-02-07 DIAGNOSIS — L239 Allergic contact dermatitis, unspecified cause: Secondary | ICD-10-CM | POA: Diagnosis not present

## 2018-02-22 DIAGNOSIS — Z1211 Encounter for screening for malignant neoplasm of colon: Secondary | ICD-10-CM | POA: Diagnosis not present

## 2018-03-06 DIAGNOSIS — Z1211 Encounter for screening for malignant neoplasm of colon: Secondary | ICD-10-CM | POA: Diagnosis not present

## 2018-03-06 DIAGNOSIS — K640 First degree hemorrhoids: Secondary | ICD-10-CM | POA: Diagnosis not present

## 2018-05-08 ENCOUNTER — Other Ambulatory Visit: Payer: Self-pay | Admitting: Internal Medicine

## 2018-05-08 DIAGNOSIS — Z1231 Encounter for screening mammogram for malignant neoplasm of breast: Secondary | ICD-10-CM

## 2018-05-21 ENCOUNTER — Other Ambulatory Visit: Payer: Self-pay | Admitting: Internal Medicine

## 2018-05-21 DIAGNOSIS — N644 Mastodynia: Secondary | ICD-10-CM

## 2018-05-21 DIAGNOSIS — E559 Vitamin D deficiency, unspecified: Secondary | ICD-10-CM | POA: Diagnosis not present

## 2018-05-21 DIAGNOSIS — R7303 Prediabetes: Secondary | ICD-10-CM | POA: Diagnosis not present

## 2018-05-25 ENCOUNTER — Ambulatory Visit
Admission: RE | Admit: 2018-05-25 | Discharge: 2018-05-25 | Disposition: A | Discharge status: Home / Self Care | Payer: 59 | Source: Ambulatory Visit | Attending: Internal Medicine | Admitting: Internal Medicine

## 2018-05-25 ENCOUNTER — Ambulatory Visit: Payer: 59

## 2018-05-25 DIAGNOSIS — N644 Mastodynia: Secondary | ICD-10-CM

## 2018-05-25 DIAGNOSIS — R922 Inconclusive mammogram: Secondary | ICD-10-CM | POA: Diagnosis not present

## 2019-04-23 ENCOUNTER — Telehealth: Payer: Self-pay | Admitting: *Deleted

## 2019-04-23 NOTE — Telephone Encounter (Signed)
A message was left, re: her new patient appointment. 

## 2019-05-28 ENCOUNTER — Other Ambulatory Visit: Payer: Self-pay

## 2019-05-28 ENCOUNTER — Other Ambulatory Visit: Payer: Self-pay | Admitting: Internal Medicine

## 2019-05-28 ENCOUNTER — Ambulatory Visit
Admission: RE | Admit: 2019-05-28 | Discharge: 2019-05-28 | Disposition: A | Discharge status: Home / Self Care | Payer: PRIVATE HEALTH INSURANCE | Source: Ambulatory Visit | Attending: Internal Medicine | Admitting: Internal Medicine

## 2019-05-28 DIAGNOSIS — Z1231 Encounter for screening mammogram for malignant neoplasm of breast: Secondary | ICD-10-CM

## 2019-05-29 ENCOUNTER — Other Ambulatory Visit: Payer: Self-pay | Admitting: Internal Medicine

## 2019-05-29 DIAGNOSIS — R928 Other abnormal and inconclusive findings on diagnostic imaging of breast: Secondary | ICD-10-CM

## 2019-06-06 ENCOUNTER — Ambulatory Visit: Payer: PRIVATE HEALTH INSURANCE

## 2019-06-06 ENCOUNTER — Ambulatory Visit
Admission: RE | Admit: 2019-06-06 | Discharge: 2019-06-06 | Disposition: A | Discharge status: Home / Self Care | Payer: No Typology Code available for payment source | Source: Ambulatory Visit | Attending: Internal Medicine | Admitting: Internal Medicine

## 2019-06-06 ENCOUNTER — Ambulatory Visit
Admission: RE | Admit: 2019-06-06 | Discharge: 2019-06-06 | Disposition: A | Discharge status: Home / Self Care | Payer: PRIVATE HEALTH INSURANCE | Source: Ambulatory Visit | Attending: Internal Medicine | Admitting: Internal Medicine

## 2019-06-06 ENCOUNTER — Other Ambulatory Visit: Payer: Self-pay

## 2019-06-06 DIAGNOSIS — R928 Other abnormal and inconclusive findings on diagnostic imaging of breast: Secondary | ICD-10-CM

## 2019-06-12 ENCOUNTER — Encounter: Payer: Self-pay | Admitting: Cardiovascular Disease

## 2019-06-12 ENCOUNTER — Other Ambulatory Visit: Payer: Self-pay

## 2019-06-12 ENCOUNTER — Ambulatory Visit (INDEPENDENT_AMBULATORY_CARE_PROVIDER_SITE_OTHER): Payer: No Typology Code available for payment source | Admitting: Cardiovascular Disease

## 2019-06-12 DIAGNOSIS — I471 Supraventricular tachycardia: Secondary | ICD-10-CM

## 2019-06-12 DIAGNOSIS — I493 Ventricular premature depolarization: Secondary | ICD-10-CM | POA: Diagnosis not present

## 2019-06-12 NOTE — Patient Instructions (Signed)
Medication Instructions:  Your physician recommends that you continue on your current medications as directed. Please refer to the Current Medication list given to you today.  *If you need a refill on your cardiac medications before your next appointment, please call your pharmacy*  Lab Work: NONE   Testing/Procedures: NONE   Follow-Up: At [facility], you and your health needs are our priority.  As part of our continuing mission to provide you with exceptional heart care, we have created designated Provider Care Teams.  These Care Teams include your primary Cardiologist (physician) and Advanced Practice Providers (APPs -  Physician Assistants and Nurse Practitioners) who all work together to provide you with the care you need, when you need it.  Your next appointment:   4 month(s)  The format for your next appointment:   In Person  Provider:   You may see DR [facility] or one of the following Advanced Practice Providers on your designated Care Team:    Kerin Ransom, PA-C  [place], [place]  Coletta Memos, [place]

## 2019-06-12 NOTE — Progress Notes (Signed)
Cardiology Office Note   Date:  06/30/2019   ID:  Casey Wall, DOB 11-30-1955, MRN [id number]  PCP:  Wenda Low, MD  Cardiologist:   Skeet Latch, MD   No chief complaint on file.   History of Present Illness: Casey Wall is a 64 y.o. female with breast cancer s/p XRT and lumpectomy who is being seen today for the evaluation of palpitations at the request of Wenda Low, MD.  She reports feeling palpitations that seem to occur randomly.  When it happens it makes her feel short of breath.  There is no associated with chest pain but it does make her lightheaded.  She denies syncope.   After the event she has a headache.  Symptoms started one year ago.  She initially thought it may be caffeine related and stopped drinking coffee but had persistent symptoms.  It occurs at rest.  She also had an episode that occurred when exercising recently.  She does yoga and tai chi once per week.  She has no exertional chest pain.  She denies lower extremity edema, orthopnea, or PND.   Past Medical History:  Diagnosis Date  . Anemia   . Breast cancer ([place]) 04/2011   right  . Cancer of upper-outer quadrant of female breast ([place]) 05/10/2011  . Constipation   . GERD (gastroesophageal reflux disease) 2012  . History of fibrocystic disease of breast   . Hot flashes related to aromatase inhibitor therapy    Arimidex  . Personal history of radiation therapy   . PVC (premature ventricular contraction) 06/30/2019  . Seasonal allergies   . Status post radiation therapy 08/08/11 - 09/21/11   Right Breast: 50.4 Gy/28 Fractions: Boost 10 Gy/ 5 Fractions  . Stomach ulcer 2012  . SVT (supraventricular tachycardia) ([place]) 06/30/2019    Past Surgical History:  Procedure Laterality Date  . BREAST BIOPSY    . breast cyst aspirated    . BREAST LUMPECTOMY  06/01/11   right  . CERVICAL DISCECTOMY  2007  . CESAREAN SECTION  1980  . herniated disc surgery  2007   neck  . TOTAL ABDOMINAL  HYSTERECTOMY  1997     Current Outpatient Medications  Medication Sig Dispense Refill  . cetirizine (ZYRTEC) 10 MG tablet Take 10 mg by mouth daily.    . Cholecalciferol (VITAMIN D PO) Take 2,000 mg by mouth daily.     . fluticasone (FLONASE) 50 MCG/ACT nasal spray Place 2 sprays into the nose as needed.     . pantoprazole (PROTONIX) 40 MG tablet     . Probiotic Product (SOLUBLE FIBER/PROBIOTICS PO) Take 1 capsule by mouth daily.     . tamoxifen (NOLVADEX) 20 MG tablet TAKE 1 TABLET BY MOUTH DAILY 90 tablet 3  . tretinoin microspheres (RETIN-A MICRO) 0.04 % gel Apply topically at bedtime.     Marland Kitchen UNABLE TO FIND 1 capsule 2 (two) times daily. Cleanse More OTC    . venlafaxine XR (EFFEXOR-XR) 75 MG 24 hr capsule TAKE 1 CAPSULE (75 MG TOTAL) BY MOUTH DAILY. 90 capsule 3   No current facility-administered medications for this visit.    Allergies:   Vaccinium angustifolium    Social History:  The patient  reports that she has never smoked. She has never used smokeless tobacco. She reports current alcohol use. She reports that she does not use drugs.   Family History:  The patient's family history includes CAD in her mother; Cancer in her father, paternal aunt,  paternal uncle, and another family member; Diabetes in her mother and sister; Heart attack in her maternal grandmother; Lung cancer in her father; Stroke in her maternal grandmother.    ROS:  Please see the history of present illness.   Otherwise, review of systems are positive for none.   All other systems are reviewed and negative.    PHYSICAL EXAM: VS:  BP 128/80   Pulse 100   Ht '5\' 6"'$  (1.676 m)   Wt 172 lb 12.8 oz (78.4 kg)   SpO2 97%   BMI 27.89 kg/m  , BMI Body mass index is 27.89 kg/m. GENERAL:  Well appearing HEENT:  Pupils equal round and reactive, fundi not visualized, oral mucosa unremarkable NECK:  No jugular venous distention, waveform within normal limits, carotid upstroke brisk and symmetric, no bruits, no  thyromegaly LYMPHATICS:  No cervical adenopathy LUNGS:  Clear to auscultation bilaterally HEART:  RRR.  PMI not displaced or sustained,S1 and S2 within normal limits, no S3, no S4, no clicks, no rubs, no murmurs ABD:  Flat, positive bowel sounds normal in frequency in pitch, no bruits, no rebound, no guarding, no midline pulsatile mass, no hepatomegaly, no splenomegaly EXT:  2 plus pulses throughout, no edema, no cyanosis no clubbing SKIN:  No rashes no nodules NEURO:  Cranial nerves II through XII grossly intact, motor grossly intact throughout PSYCH:  Cognitively intact, oriented to person place and time   EKG:  EKG is not ordered today.  14 Day Event Monitor 02/2019: Sinus rhythm.  Short runs of SVT,  PVCs  (1.5%) noted.  Ventricular bigeminy and trigeminy.   Recent Labs: No results found for requested labs within last 8760 hours.    Lipid Panel No results found for: CHOL, TRIG, HDL, CHOLHDL, VLDL, LDLCALC, LDLDIRECT    Wt Readings from Last 3 Encounters:  06/12/19 172 lb 12.8 oz (78.4 kg)  06/22/17 165 lb (74.8 kg)  01/24/17 156 lb (70.8 kg)      ASSESSMENT AND PLAN:  # PVCs: # SVT: Ms. Snellgrove had episode of SVT and PVCs on her monitor.  She is symptomatic.  She was not interested in trying a medication at this time and will try to manage her symptoms with Vagal maneuvers and the knowledge that this is not dangerous.   Current medicines are reviewed at length with the patient today.  The patient does not have concerns regarding medicines.  The following changes have been made:  no change  Labs/ tests ordered today include:  No orders of the defined types were placed in this encounter.    Disposition:   FU with Caidence Kaseman C. Oval Linsey, MD, [facility] in 4 months.     Signed, Orin Eberwein C. Oval Linsey, MD, [facility]  06/30/2019 10:37 AM    [place]

## 2019-06-27 ENCOUNTER — Ambulatory Visit: Payer: PRIVATE HEALTH INSURANCE

## 2019-06-30 ENCOUNTER — Encounter: Payer: Self-pay | Admitting: Cardiovascular Disease

## 2019-06-30 DIAGNOSIS — I471 Supraventricular tachycardia, unspecified: Secondary | ICD-10-CM | POA: Insufficient documentation

## 2019-06-30 DIAGNOSIS — I493 Ventricular premature depolarization: Secondary | ICD-10-CM

## 2019-06-30 HISTORY — DX: Ventricular premature depolarization: I49.3

## 2019-06-30 HISTORY — DX: Supraventricular tachycardia: I47.1

## 2019-06-30 HISTORY — DX: Supraventricular tachycardia, unspecified: I47.10

## 2019-07-04 ENCOUNTER — Ambulatory Visit: Payer: Self-pay | Attending: Internal Medicine

## 2019-07-04 DIAGNOSIS — Z23 Encounter for immunization: Secondary | ICD-10-CM | POA: Insufficient documentation

## 2019-07-04 NOTE — Progress Notes (Signed)
   Covid-19 Vaccination Clinic  Name:  Casey Wall    MRN: [id number] DOB: [birth year]  07/04/2019  [name] was observed post Covid-19 immunization for 30 minutes based on pre-vaccination screening without incidence. She was provided with Vaccine Information Sheet and instruction to access the V-Safe system.   [name] was instructed to call 911 with any severe reactions post vaccine: Marland Kitchen Difficulty breathing  . Swelling of your face and throat  . A fast heartbeat  . A bad rash all over your body  . Dizziness and weakness    Immunizations Administered    Name Date Dose VIS Date Route   Pfizer COVID-19 Vaccine 07/04/2019  8:27 AM 0.3 mL 05/10/2019 Intramuscular   Manufacturer: [place]   Lot: [id number]   [place]: [id number]

## 2019-07-18 ENCOUNTER — Ambulatory Visit: Payer: PRIVATE HEALTH INSURANCE

## 2019-07-29 ENCOUNTER — Ambulatory Visit: Payer: Self-pay

## 2019-07-30 ENCOUNTER — Ambulatory Visit: Payer: Self-pay | Attending: Internal Medicine

## 2019-07-30 DIAGNOSIS — Z23 Encounter for immunization: Secondary | ICD-10-CM | POA: Insufficient documentation

## 2019-07-30 NOTE — Progress Notes (Signed)
   Covid-19 Vaccination Clinic  Name:  Casey Wall    MRN: [id number] DOB: 01-19-1956  07/30/2019  [name] was observed post Covid-19 immunization for 30 minutes based on pre-vaccination screening without incident. She was provided with Vaccine Information Sheet and instruction to access the V-Safe system.   [name] was instructed to call 911 with any severe reactions post vaccine: Marland Kitchen Difficulty breathing  . Swelling of face and throat  . A fast heartbeat  . A bad rash all over body  . Dizziness and weakness   Immunizations Administered    Name Date Dose VIS Date Route   Pfizer COVID-19 Vaccine 07/30/2019  8:55 AM 0.3 mL 05/10/2019 Intramuscular   Manufacturer: [place]   Lot: [id number]   [place]: [id number]

## 2019-10-08 ENCOUNTER — Ambulatory Visit: Payer: Medicare Other | Admitting: Cardiovascular Disease

## 2020-03-10 ENCOUNTER — Ambulatory Visit: Payer: Medicare Other

## 2020-07-06 ENCOUNTER — Other Ambulatory Visit: Payer: Self-pay | Admitting: Internal Medicine

## 2020-07-06 DIAGNOSIS — Z1231 Encounter for screening mammogram for malignant neoplasm of breast: Secondary | ICD-10-CM

## 2020-07-23 DIAGNOSIS — J019 Acute sinusitis, unspecified: Secondary | ICD-10-CM | POA: Diagnosis not present

## 2020-08-19 ENCOUNTER — Ambulatory Visit: Payer: Medicare Other

## 2020-08-20 ENCOUNTER — Other Ambulatory Visit: Payer: Self-pay

## 2020-08-20 ENCOUNTER — Ambulatory Visit
Admission: RE | Admit: 2020-08-20 | Discharge: 2020-08-20 | Disposition: A | Discharge status: Home / Self Care | Payer: Medicare Other | Source: Ambulatory Visit | Attending: Internal Medicine | Admitting: Internal Medicine

## 2020-08-20 DIAGNOSIS — Z1231 Encounter for screening mammogram for malignant neoplasm of breast: Secondary | ICD-10-CM

## 2020-08-21 ENCOUNTER — Ambulatory Visit: Payer: Medicare Other

## 2020-10-14 DIAGNOSIS — Z20822 Contact with and (suspected) exposure to covid-19: Secondary | ICD-10-CM | POA: Diagnosis not present

## 2020-11-03 DIAGNOSIS — M25562 Pain in left knee: Secondary | ICD-10-CM | POA: Diagnosis not present

## 2020-11-03 DIAGNOSIS — S838X2A Sprain of other specified parts of left knee, initial encounter: Secondary | ICD-10-CM | POA: Diagnosis not present

## 2020-11-17 DIAGNOSIS — M25562 Pain in left knee: Secondary | ICD-10-CM | POA: Diagnosis not present

## 2021-01-26 DIAGNOSIS — H524 Presbyopia: Secondary | ICD-10-CM | POA: Diagnosis not present

## 2021-01-26 DIAGNOSIS — Q150 Congenital glaucoma: Secondary | ICD-10-CM | POA: Diagnosis not present

## 2021-02-02 DIAGNOSIS — E2839 Other primary ovarian failure: Secondary | ICD-10-CM | POA: Diagnosis not present

## 2021-02-02 DIAGNOSIS — Z853 Personal history of malignant neoplasm of breast: Secondary | ICD-10-CM | POA: Diagnosis not present

## 2021-02-02 DIAGNOSIS — Z1389 Encounter for screening for other disorder: Secondary | ICD-10-CM | POA: Diagnosis not present

## 2021-02-02 DIAGNOSIS — Z0000 Encounter for general adult medical examination without abnormal findings: Secondary | ICD-10-CM | POA: Diagnosis not present

## 2021-02-02 DIAGNOSIS — K219 Gastro-esophageal reflux disease without esophagitis: Secondary | ICD-10-CM | POA: Diagnosis not present

## 2021-02-02 DIAGNOSIS — R9431 Abnormal electrocardiogram [ECG] [EKG]: Secondary | ICD-10-CM | POA: Diagnosis not present

## 2021-02-02 DIAGNOSIS — R7303 Prediabetes: Secondary | ICD-10-CM | POA: Diagnosis not present

## 2021-02-02 DIAGNOSIS — R69 Illness, unspecified: Secondary | ICD-10-CM | POA: Diagnosis not present

## 2021-02-02 DIAGNOSIS — D72819 Decreased white blood cell count, unspecified: Secondary | ICD-10-CM | POA: Diagnosis not present

## 2021-02-02 DIAGNOSIS — Z23 Encounter for immunization: Secondary | ICD-10-CM | POA: Diagnosis not present

## 2021-02-02 DIAGNOSIS — J309 Allergic rhinitis, unspecified: Secondary | ICD-10-CM | POA: Diagnosis not present

## 2021-03-19 DIAGNOSIS — T451X5A Adverse effect of antineoplastic and immunosuppressive drugs, initial encounter: Secondary | ICD-10-CM | POA: Insufficient documentation

## 2021-03-19 DIAGNOSIS — J302 Other seasonal allergic rhinitis: Secondary | ICD-10-CM | POA: Insufficient documentation

## 2021-03-19 DIAGNOSIS — R232 Flushing: Secondary | ICD-10-CM | POA: Insufficient documentation

## 2021-03-19 DIAGNOSIS — D649 Anemia, unspecified: Secondary | ICD-10-CM | POA: Insufficient documentation

## 2021-03-19 DIAGNOSIS — Z87898 Personal history of other specified conditions: Secondary | ICD-10-CM | POA: Insufficient documentation

## 2021-03-19 DIAGNOSIS — Z923 Personal history of irradiation: Secondary | ICD-10-CM | POA: Insufficient documentation

## 2021-03-24 NOTE — Progress Notes (Incomplete)
Cardiology Office Note:    Date:  03/24/2021   ID:  Casey Wall, DOB 03-27-1956, MRN [id number]  PCP:  Wenda Low, MD  Cardiologist:  Shirlee More, MD   Referring MD: Wenda Low, MD  ASSESSMENT:    No diagnosis found. PLAN:    In order of problems listed above:  ***  Next appointment   Medication Adjustments/Labs and Tests Ordered: Current medicines are reviewed at length with the patient today.  Concerns regarding medicines are outlined above.  No orders of the defined types were placed in this encounter.  No orders of the defined types were placed in this encounter.    No chief complaint on file. ***  History of Present Illness:    Casey Wall is a 65 y.o. female who is being seen today for the evaluation of abnormal EKG at the request of Wenda Low, MD. The EKG copy was not sent to the office the report states that the EKG is abnormal with T wave inverted new since 2020.  She has been seen previously by Dr. Mickle Plumb heart care with a history of breast cancer XRT and lumpectomy with a clinical problem with palpitation she had a event monitor 14 days which showed occasional PVCs with bigeminy trigeminy and short runs of SVT.  Recent laboratory tests are 02/02/2021: Cholesterol 208 LDL 101 triglycerides 90 HDL 91 A1c 5.8% TSH normal 0.79 CMP normal creatinine 0.79 GFR 82 cc potassium 4.5 Hemoglobin 12.6 Past Medical History:  Diagnosis Date   Anemia    Breast cancer ([place]) 04/2011   right   Cancer of upper-outer quadrant of female breast ([place]) 05/10/2011   Constipation    GERD (gastroesophageal reflux disease) 2012   History of fibrocystic disease of breast    Hot flashes related to aromatase inhibitor therapy    Arimidex   Personal history of radiation therapy    PVC (premature ventricular contraction) 06/30/2019   Seasonal allergies    Status post radiation therapy 08/08/11 - 09/21/11   Right Breast: 50.4 Gy/28 Fractions:  Boost 10 Gy/ 5 Fractions   Stomach ulcer 2012   SVT (supraventricular tachycardia) ([place]) 06/30/2019    Past Surgical History:  Procedure Laterality Date   BREAST BIOPSY     breast cyst aspirated     BREAST LUMPECTOMY  06/01/11   right   CERVICAL DISCECTOMY  2007   CESAREAN SECTION  1980   herniated disc surgery  2007   neck   TOTAL ABDOMINAL HYSTERECTOMY  1997    Current Medications: No outpatient medications have been marked as taking for the 03/25/21 encounter (Appointment) with Richardo Priest, MD.     Allergies:   Vaccinium angustifolium   Social History   Socioeconomic History   Marital status: Married    Spouse name: Not on file   Number of children: Not on file   Years of education: Not on file   Highest education level: Not on file  Occupational History   Not on file  Tobacco Use   Smoking status: Never   Smokeless tobacco: Never  Substance and Sexual Activity   Alcohol use: Yes    Comment: occasionally   Drug use: No   Sexual activity: Yes  Other Topics Concern   Not on file  Social History Narrative   Not on file   Social Determinants of Health   Financial Resource Strain: Not on file  Food Insecurity: Not on file  Transportation Needs: Not on file  Physical  Activity: Not on file  Stress: Not on file  Social Connections: Not on file     Family History: The patient's ***family history includes Breast cancer in her mother; CAD in her mother; Cancer in her father, paternal aunt, paternal uncle, and another family member; Diabetes in her mother and sister; Heart attack in her maternal grandmother; Lung cancer in her father; Stroke in her maternal grandmother.  ROS:   ROS Please see the history of present illness.    *** All other systems reviewed and are negative.  EKGs/Labs/Other Studies Reviewed:    The following studies were reviewed today: ***  EKG:  EKG is *** ordered today.  The ekg ordered today is personally reviewed  and demonstrates ***  Recent Labs: No results found for requested labs within last 8760 hours.  Recent Lipid Panel No results found for: CHOL, TRIG, HDL, CHOLHDL, VLDL, LDLCALC, LDLDIRECT  Physical Exam:    VS:  There were no vitals taken for this visit.    Wt Readings from Last 3 Encounters:  06/12/19 172 lb 12.8 oz (78.4 kg)  06/22/17 165 lb (74.8 kg)  01/24/17 156 lb (70.8 kg)     GEN: *** Well nourished, well developed in no acute distress HEENT: Normal NECK: No JVD; No carotid bruits LYMPHATICS: No lymphadenopathy CARDIAC: ***RRR, no murmurs, rubs, gallops RESPIRATORY:  Clear to auscultation without rales, wheezing or rhonchi  ABDOMEN: Soft, non-tender, non-distended MUSCULOSKELETAL:  No edema; No deformity  SKIN: Warm and dry NEUROLOGIC:  Alert and oriented x 3 PSYCHIATRIC:  Normal affect     Signed, Shirlee More, MD  03/24/2021 8:26 AM    [place]

## 2021-03-25 ENCOUNTER — Ambulatory Visit: Payer: Medicare HMO | Admitting: Cardiology

## 2021-04-20 ENCOUNTER — Ambulatory Visit: Payer: Medicare HMO | Admitting: Cardiology

## 2021-04-29 ENCOUNTER — Other Ambulatory Visit: Payer: Self-pay

## 2021-04-29 ENCOUNTER — Ambulatory Visit (INDEPENDENT_AMBULATORY_CARE_PROVIDER_SITE_OTHER): Payer: Medicare HMO | Admitting: Cardiology

## 2021-04-29 ENCOUNTER — Encounter: Payer: Self-pay | Admitting: Cardiology

## 2021-04-29 VITALS — BP 130/78 | HR 88 | Ht 66.0 in | Wt 173.0 lb

## 2021-04-29 DIAGNOSIS — K219 Gastro-esophageal reflux disease without esophagitis: Secondary | ICD-10-CM

## 2021-04-29 DIAGNOSIS — I471 Supraventricular tachycardia: Secondary | ICD-10-CM

## 2021-04-29 DIAGNOSIS — R9431 Abnormal electrocardiogram [ECG] [EKG]: Secondary | ICD-10-CM

## 2021-04-29 DIAGNOSIS — I493 Ventricular premature depolarization: Secondary | ICD-10-CM | POA: Diagnosis not present

## 2021-04-29 NOTE — Patient Instructions (Signed)
Medication Instructions:  Your physician recommends that you continue on your current medications as directed. Please refer to the Current Medication list given to you today.  *If you need a refill on your cardiac medications before your next appointment, please call your pharmacy*   Lab Work: None If you have labs (blood work) drawn today and your tests are completely normal, you will receive your results only by: [place] (if you have MyChart) OR A paper copy in the mail If you have any lab test that is abnormal or we need to change your treatment, we will call you to review the results.   Testing/Procedures: None   Follow-Up: At [facility], you and your health needs are our priority.  As part of our continuing mission to provide you with exceptional heart care, we have created designated Provider Care Teams.  These Care Teams include your primary Cardiologist (physician) and Advanced Practice Providers (APPs -  Physician Assistants and Nurse Practitioners) who all work together to provide you with the care you need, when you need it.  We recommend signing up for the patient portal called "MyChart".  Sign up information is provided on this After Visit Summary.  MyChart is used to connect with patients for Virtual Visits (Telemedicine).  Patients are able to view lab/test results, encounter notes, upcoming appointments, etc.  Non-urgent messages can be sent to your provider as well.   To learn more about what you can do with MyChart, go to [email or web address].    Your next appointment:   As needed

## 2021-04-29 NOTE — Progress Notes (Signed)
Cardiology Consultation:    Date:  04/29/2021   ID:  Casey Wall, DOB [birth year], MRN [id number]  PCP:  Wenda Low, MD  Cardiologist:  Jenne Campus, MD   Referring MD: Wenda Low, MD   Chief Complaint  Patient presents with   Abnormal ECG    History of Present Illness:    Casey Wall is a 65 y.o. female who is being seen today for the evaluation of abnormal EKG at the request of Wenda Low, MD. past medical history significant for some palpitation that she suffered from few years ago.  Apparently monitor has been placed she was find to have some PVCs as well as nonsustained supraventricular tachycardia.  Since that time she has been doing well and she does not have any problems.  She is also prediabetic and does have history of breast cancer that required lumpectomy and radiation therapy after that.  She went for regular follow-up with her primary care physician EKG has been performed shows some new T inversion in inferior leads and she was referred to [place] for evaluation. Casey Wall overall is doing very well.  She denies have any chest pain tightness squeezing pressure burning chest.  She is trying to be active and exercise on the regular basis she practice yoga but only once a week.  She is enjoyed this tremendously.  Denies having any cardiac complaints.  Her palpitations are very rare. She does not smoke she is not on any diet Does not have family history of premature coronary artery disease  Past Medical History:  Diagnosis Date   Anemia    Breast cancer ([place]) 04/2011   right   Cancer of upper-outer quadrant of female breast ([place]) 05/10/2011   Constipation    GERD (gastroesophageal reflux disease) 2012   History of fibrocystic disease of breast    Hot flashes related to aromatase inhibitor therapy    Arimidex   Personal history of radiation therapy    PVC (premature ventricular contraction) 06/30/2019   Seasonal allergies    Status post radiation therapy  08/08/11 - 09/21/11   Right Breast: 50.4 Gy/28 Fractions: Boost 10 Gy/ 5 Fractions   Stomach ulcer 2012   SVT (supraventricular tachycardia) ([place]) 06/30/2019    Past Surgical History:  Procedure Laterality Date   BREAST BIOPSY     breast cyst aspirated     BREAST LUMPECTOMY  06/01/11   right   CERVICAL DISCECTOMY  2007   CESAREAN SECTION  1980   herniated disc surgery  2007   neck   TOTAL ABDOMINAL HYSTERECTOMY  1997    Current Medications: Current Meds  Medication Sig   cetirizine (ZYRTEC) 10 MG tablet Take 10 mg by mouth daily.   Cholecalciferol (VITAMIN D PO) Take 2,000 mg by mouth daily.    fluticasone (FLONASE) 50 MCG/ACT nasal spray Place 2 sprays into the nose as needed for allergies or rhinitis.   UNABLE TO FIND 1 capsule 2 (two) times daily. Cleanse More OTC/ Unknown strength   venlafaxine XR (EFFEXOR-XR) 75 MG 24 hr capsule TAKE 1 CAPSULE (75 MG TOTAL) BY MOUTH DAILY. (Patient taking differently: Take 75 mg by mouth daily with breakfast.)   [DISCONTINUED] Probiotic Product (SOLUBLE FIBER/PROBIOTICS PO) Take 1 capsule by mouth daily. Unknown strength     Allergies:   Other   Social History   Socioeconomic History   Marital status: Married    Spouse name: Not on file   Number of children: Not on file  Years of education: Not on file   Highest education level: Not on file  Occupational History   Not on file  Tobacco Use   Smoking status: Never   Smokeless tobacco: Never  Substance and Sexual Activity   Alcohol use: Yes    Comment: occasionally   Drug use: No   Sexual activity: Yes  Other Topics Concern   Not on file  Social History Narrative   Not on file   Social Determinants of Health   Financial Resource Strain: Not on file  Food Insecurity: Not on file  Transportation Needs: Not on file  Physical Activity: Not on file  Stress: Not on file  Social Connections: Not on file     Family History: The patient's family history includes Breast cancer in  her mother; CAD in her mother; Cancer in her father, paternal aunt, paternal uncle, and another family member; Diabetes in her mother and sister; Heart attack in her maternal grandmother; Lung cancer in her father; Stroke in her maternal grandmother. ROS:   Please see the history of present illness.    All 14 point review of systems negative except as described per history of present illness.  EKGs/Labs/Other Studies Reviewed:    The following studies were reviewed today: EKG done by primary care physician showed normal sinus rhythm normal P interval, there is limb lead reversal which show T inversion in lead III and aVF.  EKG:  EKG is  ordered today.  The ekg ordered today demonstrates normal sinus rhythm normal P interval normal QS complex duration morphology, no ST segment changes.   Recent Labs: No results found for requested labs within last 8760 hours.  Recent Lipid Panel No results found for: CHOL, TRIG, HDL, CHOLHDL, VLDL, LDLCALC, LDLDIRECT  Physical Exam:    VS:  BP 130/78 (BP Location: Right Arm, Patient Position: Sitting)   Pulse 88   Ht 5\' 6"  (1.676 m)   Wt 173 lb (78.5 kg)   SpO2 98%   BMI 27.92 kg/m     Wt Readings from Last 3 Encounters:  04/29/21 173 lb (78.5 kg)  06/12/19 172 lb 12.8 oz (78.4 kg)  06/22/17 165 lb (74.8 kg)     GEN:  Well nourished, well developed in no acute distress HEENT: Normal NECK: No JVD; No carotid bruits LYMPHATICS: No lymphadenopathy CARDIAC: RRR, no murmurs, no rubs, no gallops RESPIRATORY:  Clear to auscultation without rales, wheezing or rhonchi  ABDOMEN: Soft, non-tender, non-distended MUSCULOSKELETAL:  No edema; No deformity  SKIN: Warm and dry NEUROLOGIC:  Alert and oriented x 3 PSYCHIATRIC:  Normal affect   ASSESSMENT:    1. PVC (premature ventricular contraction)   2. SVT (supraventricular tachycardia) ([employer] [place])   3. Nonspecific abnormal electrocardiogram (ECG) (EKG)   4. Gastroesophageal reflux disease without  esophagitis    PLAN:    In order of problems listed above:  Abnormal EKG.  EKG done that showed abnormality had limited reversal which is responsible for T inversion in lead to 3 and aVF, there is also P inversion in that lead.  Also in leads aVR is a positive QRS complex.  Clearly lead reversal.  EKG done in my office was normal.  I think we can drop that issue. Palpitation that she used to have with nonsustained SVT as well as PVCs.  Now she is practically asymptomatic we will continue monitoring. Cholesterol profile I did review her LDL of 101 HDL 91 glucose of profile, no need to treat. We did talk  in length about healthy lifestyle need to exercise on the regular basis we did talk a lot about yoga.  I will schedule her to see me on as-needed basis if any cardiac problem occur.   Medication Adjustments/Labs and Tests Ordered: Current medicines are reviewed at length with the patient today.  Concerns regarding medicines are outlined above.  No orders of the defined types were placed in this encounter.  No orders of the defined types were placed in this encounter.   Signed, Park Liter, MD, [facility]. 04/29/2021 12:09 PM    [place]

## 2021-07-12 ENCOUNTER — Other Ambulatory Visit: Payer: Self-pay | Admitting: Internal Medicine

## 2021-07-12 ENCOUNTER — Ambulatory Visit
Admission: RE | Admit: 2021-07-12 | Discharge: 2021-07-12 | Disposition: A | Discharge status: Home / Self Care | Payer: Medicare HMO | Source: Ambulatory Visit | Attending: Internal Medicine | Admitting: Internal Medicine

## 2021-07-12 DIAGNOSIS — R059 Cough, unspecified: Secondary | ICD-10-CM

## 2021-07-12 DIAGNOSIS — K219 Gastro-esophageal reflux disease without esophagitis: Secondary | ICD-10-CM | POA: Diagnosis not present

## 2021-07-29 DIAGNOSIS — M1712 Unilateral primary osteoarthritis, left knee: Secondary | ICD-10-CM | POA: Diagnosis not present

## 2021-08-13 ENCOUNTER — Other Ambulatory Visit: Payer: Self-pay | Admitting: Internal Medicine

## 2021-08-13 DIAGNOSIS — Z1231 Encounter for screening mammogram for malignant neoplasm of breast: Secondary | ICD-10-CM

## 2021-08-23 ENCOUNTER — Ambulatory Visit
Admission: RE | Admit: 2021-08-23 | Discharge: 2021-08-23 | Disposition: A | Discharge status: Home / Self Care | Payer: Medicare HMO | Source: Ambulatory Visit | Attending: Internal Medicine | Admitting: Internal Medicine

## 2021-08-23 DIAGNOSIS — Z1231 Encounter for screening mammogram for malignant neoplasm of breast: Secondary | ICD-10-CM

## 2021-12-02 DIAGNOSIS — H40013 Open angle with borderline findings, low risk, bilateral: Secondary | ICD-10-CM | POA: Diagnosis not present

## 2022-02-08 ENCOUNTER — Other Ambulatory Visit: Payer: Self-pay | Admitting: Internal Medicine

## 2022-02-08 DIAGNOSIS — Z853 Personal history of malignant neoplasm of breast: Secondary | ICD-10-CM | POA: Diagnosis not present

## 2022-02-08 DIAGNOSIS — Z8711 Personal history of peptic ulcer disease: Secondary | ICD-10-CM | POA: Diagnosis not present

## 2022-02-08 DIAGNOSIS — E785 Hyperlipidemia, unspecified: Secondary | ICD-10-CM | POA: Diagnosis not present

## 2022-02-08 DIAGNOSIS — M5090 Cervical disc disorder, unspecified, unspecified cervical region: Secondary | ICD-10-CM | POA: Diagnosis not present

## 2022-02-08 DIAGNOSIS — E2839 Other primary ovarian failure: Secondary | ICD-10-CM | POA: Diagnosis not present

## 2022-02-08 DIAGNOSIS — E559 Vitamin D deficiency, unspecified: Secondary | ICD-10-CM | POA: Diagnosis not present

## 2022-02-08 DIAGNOSIS — K219 Gastro-esophageal reflux disease without esophagitis: Secondary | ICD-10-CM | POA: Diagnosis not present

## 2022-02-08 DIAGNOSIS — Z0000 Encounter for general adult medical examination without abnormal findings: Secondary | ICD-10-CM | POA: Diagnosis not present

## 2022-02-08 DIAGNOSIS — J309 Allergic rhinitis, unspecified: Secondary | ICD-10-CM | POA: Diagnosis not present

## 2022-02-08 DIAGNOSIS — R69 Illness, unspecified: Secondary | ICD-10-CM | POA: Diagnosis not present

## 2022-02-08 DIAGNOSIS — R7303 Prediabetes: Secondary | ICD-10-CM | POA: Diagnosis not present

## 2022-02-08 DIAGNOSIS — D1803 Hemangioma of intra-abdominal structures: Secondary | ICD-10-CM | POA: Diagnosis not present

## 2022-02-15 ENCOUNTER — Ambulatory Visit
Admission: EM | Admit: 2022-02-15 | Discharge: 2022-02-15 | Disposition: A | Discharge status: Home / Self Care | Payer: Medicare HMO

## 2022-02-15 ENCOUNTER — Encounter: Payer: Self-pay | Admitting: Emergency Medicine

## 2022-02-15 DIAGNOSIS — H6121 Impacted cerumen, right ear: Secondary | ICD-10-CM

## 2022-02-15 DIAGNOSIS — H60553 Acute reactive otitis externa, bilateral: Secondary | ICD-10-CM

## 2022-02-15 DIAGNOSIS — H6123 Impacted cerumen, bilateral: Secondary | ICD-10-CM | POA: Diagnosis not present

## 2022-02-15 DIAGNOSIS — H6122 Impacted cerumen, left ear: Secondary | ICD-10-CM

## 2022-02-15 MED ORDER — CIPROFLOXACIN-DEXAMETHASONE 0.3-0.1 % OT SUSP: 4.0000 [drp] | Freq: Two times a day (BID) | OTIC | 0 refills | Status: AC

## 2022-02-15 NOTE — ED Triage Notes (Signed)
Pt had check up with PCP last week informed had wax in ears and told to get OTC medications. Pt reports tried that and peroxide and now both ears hurting causing jaw pain. Adds feels like under water.

## 2022-02-15 NOTE — ED Provider Notes (Signed)
[facility]    CSN: [id number] Arrival date & time: 02/15/22  0919    HISTORY  No chief complaint on file.  HPI Casey Wall is a pleasant, 66 y.o. female who presents to urgent care today. Patient states her primary care provider advised her to purchase over-the-counter earwax softener to self treat excessive earwax in her ears.  Patient states she tried the earwax softener as well as hydrogen peroxide with no improvement of her symptoms of both ears feeling full.  Patient states that she attempted to use the bulb syringe provided with the earwax removal kit that contain the softer as a suction device rather than an irrigation device.  Patient states after she attempted this, she began to have significant pain in both ears which is radiating to her jaw on both sides.  Patient states that this time, she feels like she is underwater.  Patient states she was unable to sleep last night and decided to come to urgent care to see if we could help her clean her ears out.  Patient denies fever, hearing loss, drainage from either ear.    Past Medical History:  Diagnosis Date   Anemia    Breast cancer ([place]) 04/2011   right   Cancer of upper-outer quadrant of female breast ([place]) 05/10/2011   Constipation    GERD (gastroesophageal reflux disease) 2012   History of fibrocystic disease of breast    Hot flashes related to aromatase inhibitor therapy    Arimidex   Personal history of radiation therapy    PVC (premature ventricular contraction) 06/30/2019   Seasonal allergies    Status post radiation therapy 08/08/11 - 09/21/11   Right Breast: 50.4 Gy/28 Fractions: Boost 10 Gy/ 5 Fractions   Stomach ulcer 2012   SVT (supraventricular tachycardia) ([place]) 06/30/2019   Patient Active Problem List   Diagnosis Date Noted   Nonspecific abnormal electrocardiogram (ECG) (EKG) 04/29/2021   Anemia 03/19/2021   History of fibrocystic disease of breast 03/19/2021   Hot flashes related to  aromatase inhibitor therapy 03/19/2021   Personal history of radiation therapy 03/19/2021   Seasonal allergies 03/19/2021   PVC (premature ventricular contraction) 06/30/2019   SVT (supraventricular tachycardia) ([place]) 06/30/2019   Primary cancer of upper outer quadrant of right female breast ([place]) 05/10/2011   GERD (gastroesophageal reflux disease) 2012   Stomach ulcer 2012   Past Surgical History:  Procedure Laterality Date   BREAST BIOPSY     breast cyst aspirated     BREAST LUMPECTOMY  06/01/11   right   CERVICAL DISCECTOMY  2007   CESAREAN SECTION  1980   herniated disc surgery  2007   neck   TOTAL ABDOMINAL HYSTERECTOMY  1997   OB History   No obstetric history on file.    Home Medications    Prior to Admission medications   Medication Sig Start Date End Date Taking? Authorizing Provider  venlafaxine XR (EFFEXOR-XR) 75 MG 24 hr capsule Take 75 mg by mouth daily at 2 PM. 08/12/14  Yes [provider]  cetirizine (ZYRTEC) 10 MG tablet Take 10 mg by mouth daily.    [provider]  Cholecalciferol (VITAMIN D PO) Take 2,000 mg by mouth daily.     [provider]  fluticasone (FLONASE) 50 MCG/ACT nasal spray Place 2 sprays into the nose as needed for allergies or rhinitis.    [provider]  UNABLE TO FIND 1 capsule 2 (two) times daily. Cleanse More OTC/ Unknown  strength    [provider]  venlafaxine XR (EFFEXOR-XR) 75 MG 24 hr capsule TAKE 1 CAPSULE (75 MG TOTAL) BY MOUTH DAILY. Patient taking differently: Take 75 mg by mouth daily with breakfast. 05/13/15   Nicholas Lose, MD    Family History Family History  Problem Relation Age of Onset   Lung cancer Father    Cancer Father        lung   Diabetes Mother    CAD Mother    Breast cancer Mother    Cancer Other    Diabetes Sister    Cancer Paternal Aunt    Cancer Paternal Uncle    Stroke Maternal Grandmother    Heart attack Maternal Grandmother    Social History Social  History   Tobacco Use   Smoking status: Never   Smokeless tobacco: Never  Substance Use Topics   Alcohol use: Yes    Comment: occasionally   Drug use: No   Allergies   Other  Review of Systems Review of Systems Pertinent findings revealed after performing a 14 point review of systems has been noted in the history of present illness.  Physical Exam Triage Vital Signs ED Triage Vitals  Enc Vitals Group     BP 03/26/21 0827 (!) 147/82     Pulse Rate 03/26/21 0827 72     Resp 03/26/21 0827 18     Temp 03/26/21 0827 98.3 F (36.8 C)     Temp Source 03/26/21 0827 Oral     SpO2 03/26/21 0827 98 %     Weight --      Height --      Head Circumference --      Peak Flow --      Pain Score 03/26/21 0826 5     Pain Loc --      Pain Edu? --      Excl. in [place]? --   No data found.  Updated Vital Signs BP 129/65 (BP Location: Left Arm)   Pulse 86   Temp 98 F (36.7 C) (Oral)   Resp 17   SpO2 98%   Physical Exam Vitals and nursing note reviewed.  Constitutional:      General: She is not in acute distress.    Appearance: Normal appearance. She is not ill-appearing.  HENT:     Head: Normocephalic and atraumatic.     Salivary Glands: Right salivary gland is not diffusely enlarged or tender. Left salivary gland is not diffusely enlarged or tender.     Right Ear: Hearing and external ear normal. There is impacted cerumen.     Left Ear: Hearing and external ear normal. There is impacted cerumen.     Nose: Nose normal. No nasal deformity, septal deviation, mucosal edema, congestion or rhinorrhea.     Right Turbinates: Not enlarged, swollen or pale.     Left Turbinates: Not enlarged, swollen or pale.     Right Sinus: No maxillary sinus tenderness or frontal sinus tenderness.     Left Sinus: No maxillary sinus tenderness or frontal sinus tenderness.     Mouth/Throat:     Lips: Pink. No lesions.     Mouth: Mucous membranes are moist. No oral lesions.     Pharynx: Oropharynx is  clear. Uvula midline. No posterior oropharyngeal erythema or uvula swelling.     Tonsils: No tonsillar exudate. 0 on the right. 0 on the left.  Eyes:     General: Lids are normal.  Right eye: No discharge.        Left eye: No discharge.     Extraocular Movements: Extraocular movements intact.     Conjunctiva/sclera: Conjunctivae normal.     Right eye: Right conjunctiva is not injected.     Left eye: Left conjunctiva is not injected.  Neck:     Trachea: Trachea and phonation normal.  Cardiovascular:     Rate and Rhythm: Normal rate and regular rhythm.     Pulses: Normal pulses.     Heart sounds: Normal heart sounds. No murmur heard.    No friction rub. No gallop.  Pulmonary:     Effort: Pulmonary effort is normal. No accessory muscle usage, prolonged expiration or respiratory distress.     Breath sounds: Normal breath sounds. No stridor, decreased air movement or transmitted upper airway sounds. No decreased breath sounds, wheezing, rhonchi or rales.  Chest:     Chest wall: No tenderness.  Musculoskeletal:        General: Normal range of motion.     Cervical back: Normal range of motion and neck supple. Normal range of motion.  Lymphadenopathy:     Cervical: No cervical adenopathy.  Skin:    General: Skin is warm and dry.     Findings: No erythema or rash.  Neurological:     General: No focal deficit present.     Mental Status: She is alert and oriented to person, place, and time.  Psychiatric:        Mood and Affect: Mood normal.        Behavior: Behavior normal.     Visual Acuity Right Eye Distance:   Left Eye Distance:   Bilateral Distance:    Right Eye Near:   Left Eye Near:    Bilateral Near:     [facility] / Diagnostics / Procedures:     Radiology No results found.  Procedures Ear Cerumen Removal  Date/Time: 02/15/2022 11:30 AM  Performed by: Kirt Boys, RN Authorized by: Lynden Oxford Scales, PA-C   Consent:    Consent obtained:  Verbal    Consent given by:  Patient   Risks, benefits, and alternatives were discussed: yes     Risks discussed:  Bleeding, dizziness, incomplete removal, infection, pain and TM perforation   Alternatives discussed:  No treatment, delayed treatment, alternative treatment, observation and referral Universal protocol:    Procedure explained and questions answered to patient or proxy's satisfaction: yes     Patient identity confirmed:  Verbally with patient and arm band Procedure details:    Location:  L ear   Procedure type: irrigation     Procedure outcomes: cerumen removed   Post-procedure details:    Inspection:  No bleeding, ear canal clear and macerated skin   Hearing quality:  Normal   Procedure completion:  Tolerated Ear Cerumen Removal  Date/Time: 02/15/2022 11:30 AM  Performed by: Kirt Boys, RN Authorized by: Lynden Oxford Scales, PA-C   Consent:    Consent obtained:  Verbal   Consent given by:  Patient   Risks, benefits, and alternatives were discussed: yes     Risks discussed:  Bleeding, dizziness, infection, incomplete removal, pain and TM perforation   Alternatives discussed:  No treatment, delayed treatment, alternative treatment, observation and referral Universal protocol:    Procedure explained and questions answered to patient or proxy's satisfaction: yes     Patient identity confirmed:  Verbally with patient and arm band Procedure details:    Location:  R ear   Procedure type: irrigation     Procedure outcomes: cerumen removed   Post-procedure details:    Inspection:  No bleeding, ear canal clear and macerated skin   Hearing quality:  Normal   Procedure completion:  Tolerated  (including critical care time) EKG  Pending results:  Labs Reviewed - No data to display  Medications Ordered in UC: Medications - No data to display  UC Diagnoses / Final Clinical Impressions(s)   I have reviewed the triage vital signs and the nursing notes.  Pertinent labs &  imaging results that were available during my care of the patient were reviewed by me and considered in my medical decision making (see chart for details).    Final diagnoses:  Impacted cerumen of left ear  Impacted cerumen, right ear  Acute reactive otitis externa, bilateral   Patient provided with a prescription for Ciprodex eardrops to use for few days until ears are feeling better.  Patient provided with handout about how to manage earwax buildup at home.  Return precautions advised.  ED Prescriptions     Medication Sig Dispense Auth. Provider   ciprofloxacin-dexamethasone (CIPRODEX) OTIC suspension Place 4 drops into both ears 2 (two) times daily for 7 days. X 7 days 7.5 mL Lynden Oxford Scales, PA-C      PDMP not reviewed this encounter.  Pending results:  Labs Reviewed - No data to display  Discharge Instructions:   Discharge Instructions      I sent a prescription for Ciprodex eardrops to your pharmacy.  Please use them twice daily until your symptoms have completely resolved.  I have also enclosed some information about earwax and how to manage this at home.    Debrox is a great product that you can purchase over-the-counter for monthly ear cleaning.    Please never use Q-tips in your ears or any other instruments or implements to attempt to remove wax yourself.  Please feel free to return to urgent care anytime that you feel like you have accumulated more wax you can handle and we will be happy to clean your ears out again.  Thank you for visiting urgent care today.      Disposition Upon Discharge:  Condition: stable for discharge home  Patient presented with an acute illness with associated systemic symptoms and significant discomfort requiring urgent management. In my opinion, this is a condition that a prudent lay person (someone who possesses an average knowledge of health and medicine) may potentially expect to result in complications if not addressed  urgently such as respiratory distress, impairment of bodily function or dysfunction of bodily organs.   Routine symptom specific, illness specific and/or disease specific instructions were discussed with the patient and/or caregiver at length.   As such, the patient has been evaluated and assessed, work-up was performed and treatment was provided in alignment with urgent care protocols and evidence based medicine.  Patient/parent/caregiver has been advised that the patient may require follow up for further testing and treatment if the symptoms continue in spite of treatment, as clinically indicated and appropriate.  Patient/parent/caregiver has been advised to return to the [facility] or PCP if no better; to PCP or the Emergency Department if new signs and symptoms develop, or if the current signs or symptoms continue to change or worsen for further workup, evaluation and treatment as clinically indicated and appropriate  The patient will follow up with their current PCP if and as advised. If the patient does not currently have  a PCP we will assist them in obtaining one.   The patient may need specialty follow up if the symptoms continue, in spite of conservative treatment and management, for further workup, evaluation, consultation and treatment as clinically indicated and appropriate.   Patient/parent/caregiver verbalized understanding and agreement of plan as discussed.  All questions were addressed during visit.  Please see discharge instructions below for further details of plan.  This office note has been dictated using Museum/gallery curator.  Unfortunately, this method of dictation can sometimes lead to typographical or grammatical errors.  I apologize for your inconvenience in advance if this occurs.  Please do not hesitate to reach out to me if clarification is needed.      Lynden Oxford Scales, PA-C 02/16/22 1121

## 2022-02-15 NOTE — Discharge Instructions (Addendum)
I sent a prescription for Ciprodex eardrops to your pharmacy.  Please use them twice daily until your symptoms have completely resolved.  I have also enclosed some information about earwax and how to manage this at home.    Debrox is a great product that you can purchase over-the-counter for monthly ear cleaning.    Please never use Q-tips in your ears or any other instruments or implements to attempt to remove wax yourself.  Please feel free to return to urgent care anytime that you feel like you have accumulated more wax you can handle and we will be happy to clean your ears out again.  Thank you for visiting urgent care today.

## 2022-03-10 IMAGING — DX DG CHEST 2V
2 series · 2 of 2 positions shown · non-contrast
Comparison: 01/17/2017

CLINICAL DATA: 65-year-old female with cough

EXAM:
CHEST - 2 VIEW

[dg chest 2 view (1 of 2)]
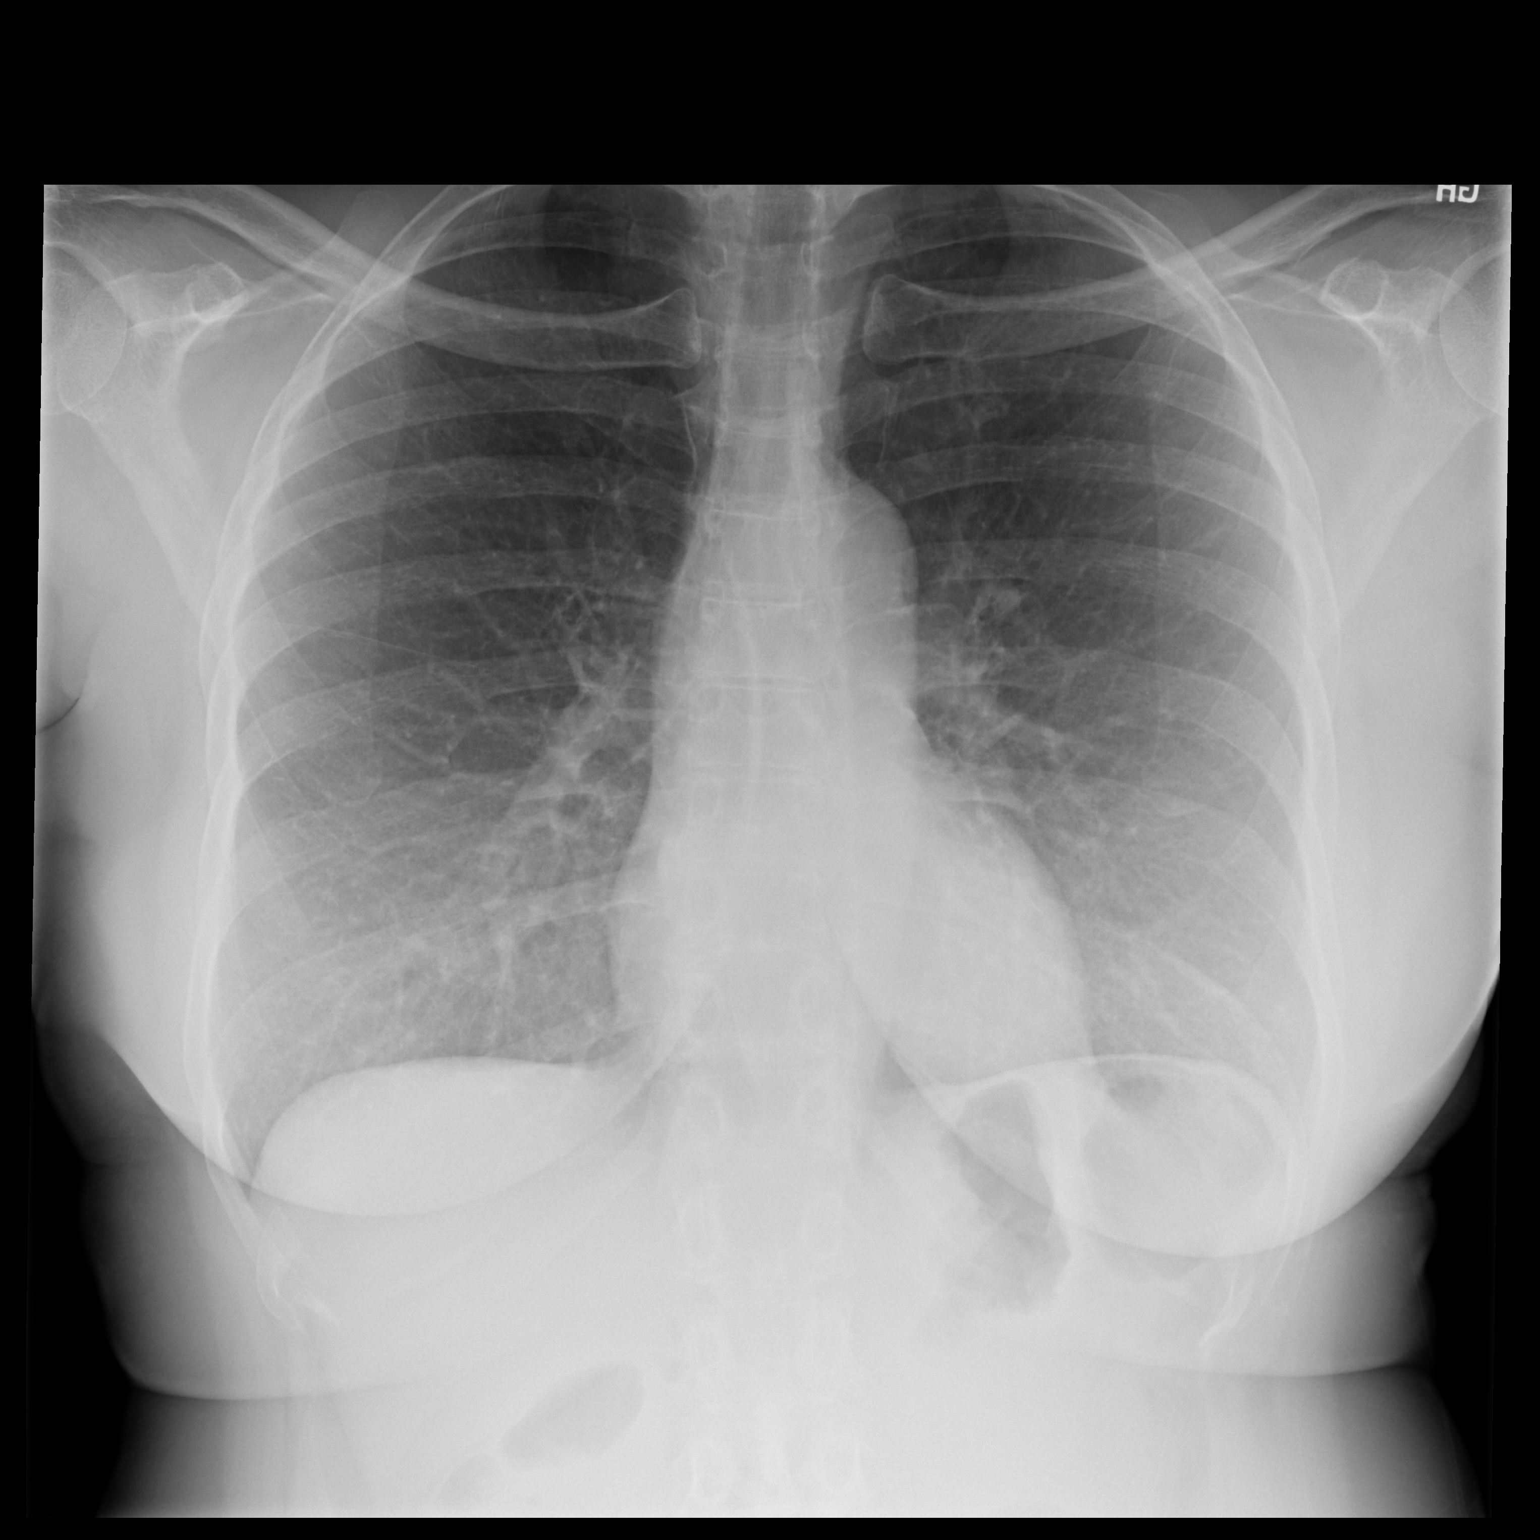

[dg chest 2 view (2 of 2)]
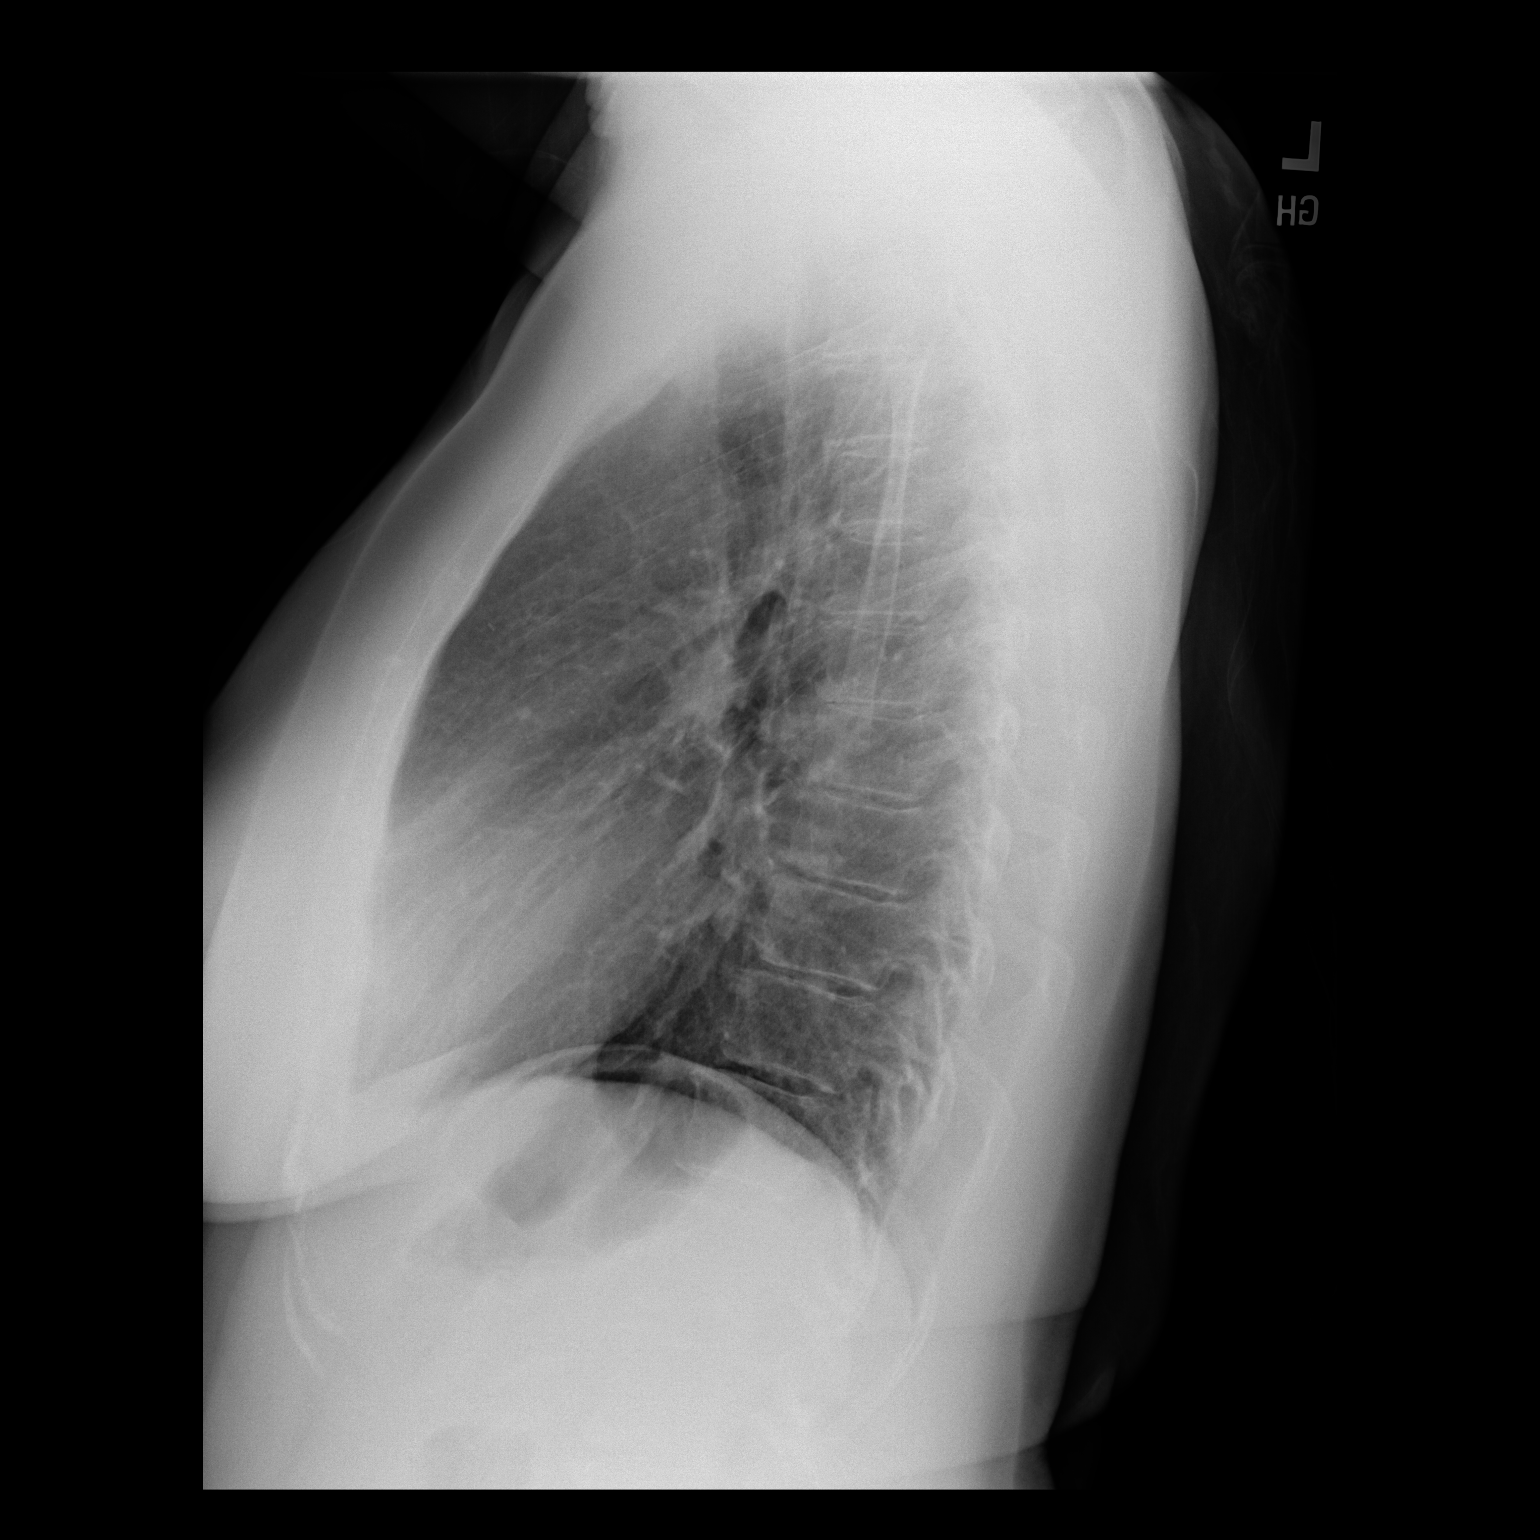

[2 of 2 positions shown; findings below may reference images not displayed]

FINDINGS: Cardiomediastinal silhouette unchanged in size and contour. No
evidence of central vascular congestion. No interlobular septal
thickening.

No pneumothorax or pleural effusion. Coarsened interstitial
markings, with no confluent airspace disease.

No acute displaced fracture. Degenerative changes of the spine.
IMPRESSION: No active cardiopulmonary disease.

## 2022-04-27 DIAGNOSIS — Q150 Congenital glaucoma: Secondary | ICD-10-CM | POA: Diagnosis not present

## 2022-04-27 DIAGNOSIS — H04123 Dry eye syndrome of bilateral lacrimal glands: Secondary | ICD-10-CM | POA: Diagnosis not present

## 2022-04-27 DIAGNOSIS — H524 Presbyopia: Secondary | ICD-10-CM | POA: Diagnosis not present

## 2022-04-27 DIAGNOSIS — H40013 Open angle with borderline findings, low risk, bilateral: Secondary | ICD-10-CM | POA: Diagnosis not present

## 2022-04-27 DIAGNOSIS — Z135 Encounter for screening for eye and ear disorders: Secondary | ICD-10-CM | POA: Diagnosis not present

## 2022-05-05 DIAGNOSIS — J029 Acute pharyngitis, unspecified: Secondary | ICD-10-CM | POA: Diagnosis not present

## 2022-05-05 DIAGNOSIS — Z20822 Contact with and (suspected) exposure to covid-19: Secondary | ICD-10-CM | POA: Diagnosis not present

## 2022-05-06 ENCOUNTER — Encounter (HOSPITAL_BASED_OUTPATIENT_CLINIC_OR_DEPARTMENT_OTHER): Payer: Self-pay | Admitting: Emergency Medicine

## 2022-05-06 ENCOUNTER — Emergency Department (HOSPITAL_BASED_OUTPATIENT_CLINIC_OR_DEPARTMENT_OTHER)
Admission: EM | Admit: 2022-05-06 | Discharge: 2022-05-06 | Disposition: A | Discharge status: Home / Self Care | Payer: Medicare HMO | Attending: Emergency Medicine | Admitting: Emergency Medicine

## 2022-05-06 ENCOUNTER — Other Ambulatory Visit: Payer: Self-pay

## 2022-05-06 DIAGNOSIS — J069 Acute upper respiratory infection, unspecified: Secondary | ICD-10-CM | POA: Insufficient documentation

## 2022-05-06 DIAGNOSIS — R059 Cough, unspecified: Secondary | ICD-10-CM | POA: Diagnosis not present

## 2022-05-06 DIAGNOSIS — U071 COVID-19: Secondary | ICD-10-CM | POA: Diagnosis not present

## 2022-05-06 DIAGNOSIS — Z853 Personal history of malignant neoplasm of breast: Secondary | ICD-10-CM | POA: Insufficient documentation

## 2022-05-06 DIAGNOSIS — Z9011 Acquired absence of right breast and nipple: Secondary | ICD-10-CM | POA: Diagnosis not present

## 2022-05-06 LAB — RESP PANEL BY RT-PCR (FLU A&B, COVID) ARPGX2
Influenza A by PCR: NEGATIVE
Influenza B by PCR: NEGATIVE
SARS Coronavirus 2 by RT PCR: POSITIVE — AB

## 2022-05-06 MED ORDER — ACETAMINOPHEN 325 MG PO TABS
650.0000 mg | ORAL_TABLET | Freq: Once | ORAL | Status: AC
  Administered 2022-05-06: 650 mg via ORAL

## 2022-05-06 NOTE — ED Triage Notes (Signed)
Generalized body aches, headache, fever, cough, since yesterday.  Went to [facility] yesterday and tested negative for covid and strep

## 2022-05-06 NOTE — Discharge Instructions (Signed)
Please continue to take Mucinex for symptom relief. Please take tylenol/ibuprofen for pain. I recommend close follow-up with PCP for reevaluation.  Please do not hesitate to return to emergency department if worrisome signs symptoms we discussed become apparent.

## 2022-05-06 NOTE — ED Provider Notes (Signed)
[place] EM[facility] DEPARTMENT Provider Note   CSN: [id number] Arrival date & time: 05/06/22  8657     History  Chief Complaint  Patient presents with   URI    Casey Wall is a 66 y.o. female.  With a past medical history of anemia, GERD, PVC, breast cancer presenting to the emergency department for evaluation of upper respiratory infection.  Patient states she had has bodyaches, dry cough, congestion, runny nose, headache since yesterday.  States she exposed to someone with COVID on Wednesday.  Patient reports fever of 100.6 at home last night.  She has tried Mucinex at home.  Reports chest pain with cough.  Endorses nausea and shortness of breath.  Denies bowel changes, urinary symptoms.   URI     Past Medical History:  Diagnosis Date   Anemia    Breast cancer ([place]) 04/2011   right   Cancer of upper-outer quadrant of female breast ([place]) 05/10/2011   Constipation    GERD (gastroesophageal reflux disease) 2012   History of fibrocystic disease of breast    Hot flashes related to aromatase inhibitor therapy    Arimidex   Personal history of radiation therapy    PVC (premature ventricular contraction) 06/30/2019   Seasonal allergies    Status post radiation therapy 08/08/11 - 09/21/11   Right Breast: 50.4 Gy/28 Fractions: Boost 10 Gy/ 5 Fractions   Stomach ulcer 2012   SVT (supraventricular tachycardia) 06/30/2019   Past Surgical History:  Procedure Laterality Date   BREAST BIOPSY     breast cyst aspirated     BREAST LUMPECTOMY  06/01/11   right   CERVICAL DISCECTOMY  2007   CESAREAN SECTION  1980   herniated disc surgery  2007   neck   TOTAL ABDOMINAL HYSTERECTOMY  1997     Home Medications Prior to Admission medications   Medication Sig Start Date End Date Taking? Authorizing Provider  cetirizine (ZYRTEC) 10 MG tablet Take 10 mg by mouth daily.    [provider]  Cholecalciferol (VITAMIN D PO) Take 2,000 mg by mouth daily.     [provider]  fluticasone (FLONASE) 50 MCG/ACT nasal spray Place 2 sprays into the nose as needed for allergies or rhinitis.    [provider]  UNABLE TO FIND 1 capsule 2 (two) times daily. Cleanse More OTC/ Unknown strength    [provider]  venlafaxine XR (EFFEXOR-XR) 75 MG 24 hr capsule TAKE 1 CAPSULE (75 MG TOTAL) BY MOUTH DAILY. Patient taking differently: Take 75 mg by mouth daily with breakfast. 05/13/15   Nicholas Lose, MD  venlafaxine XR (EFFEXOR-XR) 75 MG 24 hr capsule Take 75 mg by mouth daily at 2 PM. 08/12/14   [provider]      Allergies    Other    Review of Systems   Review of Systems Negative except as per HPI.  Physical Exam Updated Vital Signs BP 126/65 (BP Location: Right Arm)   Pulse 88   Temp 100.2 F (37.9 C) (Oral)   Resp 18   Ht '5\' 7"'$  (1.702 m)   Wt 75.8 kg   SpO2 98%   BMI 26.16 kg/m  Physical Exam Vitals and nursing note reviewed.  Constitutional:      Appearance: Normal appearance.  HENT:     Head: Normocephalic and atraumatic.     Mouth/Throat:     Mouth: Mucous membranes are moist.  Eyes:     General: No scleral icterus.  Cardiovascular:     Rate and Rhythm: Regular rhythm. Tachycardia present.     Pulses: Normal pulses.     Heart sounds: Normal heart sounds.  Pulmonary:     Effort: Pulmonary effort is normal.     Breath sounds: Normal breath sounds.  Abdominal:     General: Abdomen is flat.     Palpations: Abdomen is soft.     Tenderness: There is no abdominal tenderness.  Musculoskeletal:        General: No deformity.  Skin:    General: Skin is warm.     Findings: No rash.  Neurological:     General: No focal deficit present.     Mental Status: She is alert.  Psychiatric:        Mood and Affect: Mood normal.     ED Results / Procedures / Treatments   Labs (all labs ordered are listed, but only abnormal results are displayed) Labs Reviewed  RESP PANEL BY RT-PCR (FLU A&B, COVID) ARPGX2 -  Abnormal; Notable for the following components:      Result Value   SARS Coronavirus 2 by RT PCR POSITIVE (*)    All other components within normal limits    EKG None  Radiology No results found.  Procedures Procedures    Medications Ordered in ED Medications  acetaminophen (TYLENOL) tablet 650 mg (650 mg Oral Given 05/06/22 8413)    ED Course/ Medical Decision Making/ A&P                           Medical Decision Making Risk OTC drugs.   This patient presents to the ED for cough, body aches, headache, nausea, runny nose, this involves an extensive number of treatment options, and is a complaint that carries with a high risk of complications and morbidity.  The differential diagnosis includes COVID, flu, viral upper respiratory infection, bronchitis, pharyngitis.  This is not an exhaustive list.  Comorbidities that complicate the patient evaluation See HPI  Social determinants of health NA  Additional history obtained: External records from outside source obtained and reviewed including: Chart review including previous notes, labs, imaging.  Cardiac monitoring/EKG: The patient was maintained on a cardiac monitor.  I personally reviewed and interpreted the cardiac monitor which showed an underlying rhythm of: Sinus rhythm.  Lab tests: I ordered and personally interpreted labs.  The pertinent results include: Respiratory panel positive for COVID  Imaging studies: I ordered imaging studies including:  Problem list/ ED course/ Critical interventions/ Medical management: HPI: See above Vital signs significant for heart rate 106 otherwise within normal range and stable throughout visit. Laboratory/imaging studies significant for: See above. On physical examination, patient is afebrile and appears in no acute distress.  Heart sounds normal.  Lungs are clear, no wheezes or rales.  Respiratory panel positive for COVID.  Based on patient's clinical presentations and  laboratory/imaging studies I suspect viral upper respiratory infection caused by COVID.  I feel that her chest tightness is related to her cough, ACS unlikely advised patient to continue to take Mucinex at home, quarantine, stay hydrated.  Follow-up with PCP for further evaluation and management.  Return to the ER if new or worsening symptoms I have reviewed the patient home medicines and have made adjustments as needed.  Consultations obtained:  Disposition Continued outpatient therapy. Follow-up with PCP  recommended for reevaluation of symptoms. Treatment plan discussed with patient.  Pt acknowledged understanding was agreeable to the plan.  Worrisome signs and symptoms were discussed with patient, and patient acknowledged understanding to return to the ED if they noticed these signs and symptoms. Patient was stable upon discharge.   This chart was dictated using voice recognition software.  Despite best efforts to proofread,  errors can occur which can change the documentation meaning.          Final Clinical Impression(s) / ED Diagnoses Final diagnoses:  COVID    Rx / DC Orders ED Discharge Orders     None         Rex Kras, [place] 05/06/22 1814    Lajean Saver, MD 05/07/22 2325

## 2022-06-14 DIAGNOSIS — J358 Other chronic diseases of tonsils and adenoids: Secondary | ICD-10-CM | POA: Diagnosis not present

## 2022-07-06 DIAGNOSIS — H40023 Open angle with borderline findings, high risk, bilateral: Secondary | ICD-10-CM | POA: Diagnosis not present

## 2022-07-25 ENCOUNTER — Other Ambulatory Visit: Payer: Self-pay | Admitting: Internal Medicine

## 2022-07-25 DIAGNOSIS — Z1231 Encounter for screening mammogram for malignant neoplasm of breast: Secondary | ICD-10-CM

## 2022-07-27 DIAGNOSIS — N644 Mastodynia: Secondary | ICD-10-CM | POA: Diagnosis not present

## 2022-07-28 ENCOUNTER — Other Ambulatory Visit: Payer: Self-pay | Admitting: Internal Medicine

## 2022-07-28 DIAGNOSIS — N644 Mastodynia: Secondary | ICD-10-CM

## 2022-08-04 ENCOUNTER — Ambulatory Visit
Admission: RE | Admit: 2022-08-04 | Discharge: 2022-08-04 | Disposition: A | Discharge status: Home / Self Care | Payer: Medicare HMO | Source: Ambulatory Visit | Attending: Internal Medicine | Admitting: Internal Medicine

## 2022-08-04 DIAGNOSIS — Z780 Asymptomatic menopausal state: Secondary | ICD-10-CM | POA: Diagnosis not present

## 2022-08-04 DIAGNOSIS — E2839 Other primary ovarian failure: Secondary | ICD-10-CM

## 2022-08-04 DIAGNOSIS — M8589 Other specified disorders of bone density and structure, multiple sites: Secondary | ICD-10-CM | POA: Diagnosis not present

## 2022-09-01 DIAGNOSIS — J40 Bronchitis, not specified as acute or chronic: Secondary | ICD-10-CM | POA: Diagnosis not present

## 2022-09-02 ENCOUNTER — Ambulatory Visit
Admission: RE | Admit: 2022-09-02 | Discharge: 2022-09-02 | Disposition: A | Discharge status: Home / Self Care | Payer: Medicare HMO | Source: Ambulatory Visit | Attending: Internal Medicine | Admitting: Internal Medicine

## 2022-09-02 ENCOUNTER — Ambulatory Visit: Payer: Medicare HMO

## 2022-09-02 DIAGNOSIS — N644 Mastodynia: Secondary | ICD-10-CM | POA: Diagnosis not present

## 2022-09-02 DIAGNOSIS — Z853 Personal history of malignant neoplasm of breast: Secondary | ICD-10-CM | POA: Diagnosis not present

## 2023-02-10 DIAGNOSIS — K279 Peptic ulcer, site unspecified, unspecified as acute or chronic, without hemorrhage or perforation: Secondary | ICD-10-CM | POA: Diagnosis not present

## 2023-02-10 DIAGNOSIS — R7303 Prediabetes: Secondary | ICD-10-CM | POA: Diagnosis not present

## 2023-02-10 DIAGNOSIS — F411 Generalized anxiety disorder: Secondary | ICD-10-CM | POA: Diagnosis not present

## 2023-02-10 DIAGNOSIS — M25559 Pain in unspecified hip: Secondary | ICD-10-CM | POA: Diagnosis not present

## 2023-02-10 DIAGNOSIS — Z853 Personal history of malignant neoplasm of breast: Secondary | ICD-10-CM | POA: Diagnosis not present

## 2023-02-10 DIAGNOSIS — Z1389 Encounter for screening for other disorder: Secondary | ICD-10-CM | POA: Diagnosis not present

## 2023-02-10 DIAGNOSIS — E559 Vitamin D deficiency, unspecified: Secondary | ICD-10-CM | POA: Diagnosis not present

## 2023-02-10 DIAGNOSIS — Z0000 Encounter for general adult medical examination without abnormal findings: Secondary | ICD-10-CM | POA: Diagnosis not present

## 2023-02-10 DIAGNOSIS — D1803 Hemangioma of intra-abdominal structures: Secondary | ICD-10-CM | POA: Diagnosis not present

## 2023-02-10 DIAGNOSIS — Z136 Encounter for screening for cardiovascular disorders: Secondary | ICD-10-CM | POA: Diagnosis not present

## 2023-02-10 DIAGNOSIS — J309 Allergic rhinitis, unspecified: Secondary | ICD-10-CM | POA: Diagnosis not present

## 2023-02-10 DIAGNOSIS — I471 Supraventricular tachycardia, unspecified: Secondary | ICD-10-CM | POA: Diagnosis not present

## 2023-02-16 DIAGNOSIS — M7062 Trochanteric bursitis, left hip: Secondary | ICD-10-CM | POA: Diagnosis not present

## 2023-07-21 ENCOUNTER — Other Ambulatory Visit: Payer: Self-pay | Admitting: Internal Medicine

## 2023-07-21 DIAGNOSIS — Z1231 Encounter for screening mammogram for malignant neoplasm of breast: Secondary | ICD-10-CM

## 2023-09-07 ENCOUNTER — Ambulatory Visit
Admission: RE | Admit: 2023-09-07 | Discharge: 2023-09-07 | Disposition: A | Discharge status: Home / Self Care | Payer: Medicare HMO | Source: Ambulatory Visit | Attending: Internal Medicine | Admitting: Internal Medicine

## 2023-09-07 DIAGNOSIS — Z1231 Encounter for screening mammogram for malignant neoplasm of breast: Secondary | ICD-10-CM | POA: Diagnosis not present

## 2024-06-28 ENCOUNTER — Inpatient Hospital Stay
Admission: RE | Admit: 2024-06-28 | Discharge: 2024-06-28 | Disposition: A | Payer: Self-pay | Source: Ambulatory Visit | Attending: Oncology

## 2024-06-28 ENCOUNTER — Other Ambulatory Visit: Payer: Self-pay | Admitting: Oncology

## 2024-06-28 DIAGNOSIS — C210 Malignant neoplasm of anus, unspecified: Secondary | ICD-10-CM

## 2024-07-03 ENCOUNTER — Inpatient Hospital Stay

## 2024-07-03 ENCOUNTER — Inpatient Hospital Stay: Admitting: Oncology

## 2024-07-03 NOTE — Progress Notes (Signed)
 PATIENT NAVIGATOR PROGRESS NOTE  Name: Casey Wall Date: 07/03/2024 MRN: [id number]  DOB: Oct 21, 1955   Reason for visit:  Initial Med/Onc appt with Dr. Chinita Patten (2nd opinion)  Comments:   Patient was seen during her initial visit with Dr. Patten.  Patient was given my contact information and was instructed to contact office with any questions or concerns after today's visit. Patient verbalized understanding.    Time spent counseling/coordinating care: 30-45 minutes

## 2024-07-03 NOTE — Progress Notes (Unsigned)
 "  [facility]  ONCOLOGY CONSULT NOTE   PATIENT NAME: Casey Wall   MR#: [id number] DOB: 05-23-1956  DATE OF SERVICE: 07/03/2024   REFERRING PROVIDER  ***  Patient Care Team: Ransom Other, MD as PCP - General (Internal Medicine) Merrilyn Handler, MD (Inactive) (General Surgery) Melodye Coy, MD (Inactive) (Hematology and Oncology) Jason Charleston, MD (Inactive) (Radiation Oncology)    CHIEF COMPLAINT/ PURPOSE OF CONSULTATION:   ***  ASSESSMENT & PLAN:   Casey Wall is a 69 y.o. lady with a past medical history of ***, was referred to our clinic for ***  No problem-specific Assessment & Plan notes found for this encounter.   Assessment and Plan Assessment & Plan       I reviewed lab results and outside records for this visit and discussed relevant results with the patient. Diagnosis, plan of care and treatment options were also discussed in detail with the patient. Opportunity provided to ask questions and answers provided to her apparent satisfaction. Provided instructions to call our clinic with any problems, questions or concerns prior to return visit. I recommended to continue follow-up with PCP and sub-specialists. She verbalized understanding and agreed with the plan. No barriers to learning was detected.  NCCN guidelines have been consulted in the planning of this patients care.  Casey Patten, MD  07/03/2024 10:50 AM  [facility] - A DEPT OF [facility] [address] [place] [address] Dept: [phone] Dept Fax: [phone]   HISTORY OF PRESENTING ILLNESS:   I have reviewed her chart and materials related to her cancer extensively and collaborated history with the patient. Summary of oncologic history is as follows:  ONCOLOGY HISTORY:  ***  Oncology History  Primary cancer of upper outer quadrant of right female breast (HCC)  05/04/2011 Initial Diagnosis     invasive ductal carcinoma ER positive PR positive Ki-67 8% HER-2 negative   05/09/2011 Breast MRI    MRIs revealed solitary enhancing mass upper outer quadrant right breast with multiple subcentimeter lesions within the liver suspected to be cysts   06/01/2011 Surgery    right lumpectomy  1.7 cm low-grade IDC with lymphovascular invasion 1 /dew SLM positive with the deposit measuring 0.8 cm no extracapsular extension , Oncotype DX recurrence score 14 , 9% ROR low risk   08/12/2011 - 09/21/2011 Radiation Therapy    adjuvant radiation therapy   10/12/2011 - 06/19/2016 Anti-estrogen oral therapy    Arimidex  1 mg daily from May 2013 to 04/28/2014 and then switch to exemestane  25 mg daily for muscle aches and pains and vaginal dryness, switched to tamoxifen  20 mg daily 05/13/2014     INTERVAL HISTORY:  Discussed the use of AI scribe software for clinical note transcription with the patient, who gave verbal consent to proceed.  History of Present Illness     ***  MEDICAL HISTORY:  Past Medical History:  Diagnosis Date   Anemia    Breast cancer (HCC) 04/2011   right   Cancer of upper-outer quadrant of female breast (HCC) 05/10/2011   Constipation    GERD (gastroesophageal reflux disease) 2012   History of fibrocystic disease of breast    Hot flashes related to aromatase inhibitor therapy    Arimidex    Personal history of radiation therapy    PVC (premature ventricular contraction) 06/30/2019   Seasonal allergies    Status post radiation therapy 08/08/11 - 09/21/11   Right  Breast: 50.4 Gy/28 Fractions: Boost 10 Gy/ 5 Fractions   Stomach ulcer 2012   SVT (supraventricular tachycardia) 06/30/2019    SURGICAL HISTORY: Past Surgical History:  Procedure Laterality Date   BREAST BIOPSY     breast cyst aspirated     BREAST LUMPECTOMY  06/01/11   right   CERVICAL DISCECTOMY  2007   CESAREAN SECTION  1980   herniated disc surgery  2007   neck   TOTAL ABDOMINAL HYSTERECTOMY  1997     SOCIAL HISTORY: She reports that she has never smoked. She has never used smokeless tobacco. She reports current alcohol use. She reports that she does not use drugs. Social History   Socioeconomic History   Marital status: Married    Spouse name: Not on file   Number of children: Not on file   Years of education: Not on file   Highest education level: Not on file  Occupational History   Not on file  Tobacco Use   Smoking status: Never   Smokeless tobacco: Never  Substance and Sexual Activity   Alcohol use: Yes    Comment: occasionally   Drug use: No   Sexual activity: Yes  Other Topics Concern   Not on file  Social History Narrative   Not on file   Social Drivers of Health   Tobacco Use: Low Risk  (07/03/2024)   Received from [facility]   Patient History    Smoking Tobacco Use: Never    Smokeless Tobacco Use: Never    Passive Exposure: Not on file  Financial Resource Strain: Not on file  Food Insecurity: Not on file  Transportation Needs: Not on file  Physical Activity: Not on file  Stress: Not on file  Social Connections: Not on file  Intimate Partner Violence: Not on file  Depression (PHQ2-9): Not on file  Alcohol Screen: Not on file  Housing: Not on file  Utilities: Not on file  Health Literacy: Not on file    FAMILY HISTORY: Family History  Problem Relation Age of Onset   Lung cancer Father    Cancer Father        lung   Diabetes Mother    CAD Mother    Breast cancer Mother    Cancer Other    Diabetes Sister    Cancer Paternal Aunt    Cancer Paternal Uncle    Stroke Maternal Grandmother    Heart attack Maternal Grandmother     ALLERGIES:  She is allergic to other.  MEDICATIONS:  Current Outpatient Medications  Medication Sig Dispense Refill   cetirizine (ZYRTEC) 10 MG tablet Take 10 mg by mouth daily. (Patient taking differently: Take 10 mg by mouth daily. Takes as needed)     Cholecalciferol (VITAMIN D  PO) Take 2,000 mg by  mouth daily.      cholecalciferol (VITAMIN D3) 25 MCG (1000 UNIT) tablet Take 1,000 Units by mouth daily.     esomeprazole (NEXIUM) 20 MG capsule Take 20 mg by mouth daily at 12 noon.     ferrous sulfate 325 (65 FE) MG tablet Take 325 mg by mouth every 3 (three) days.     fluticasone (FLONASE) 50 MCG/ACT nasal spray Place 2 sprays into the nose as needed for allergies or rhinitis.     Glucosamine-Chondroitin 250-200 MG TABS Take 1 tablet by mouth daily.     minoxidil (LONITEN) 2.5 MG tablet Take 2.5 mg by mouth at bedtime. (Patient taking differently: Take 2.5 mg by mouth  at bedtime. Takes every 3 days per patient report)     venlafaxine  XR (EFFEXOR -XR) 75 MG 24 hr capsule TAKE 1 CAPSULE (75 MG TOTAL) BY MOUTH DAILY. (Patient taking differently: Take 75 mg by mouth daily with breakfast.) 90 capsule 3   venlafaxine  XR (EFFEXOR -XR) 75 MG 24 hr capsule Take 75 mg by mouth daily at 2 PM.     No current facility-administered medications for this visit.    REVIEW OF SYSTEMS:    Review of Systems - Oncology  All other pertinent systems were reviewed with the patient and are negative.  PHYSICAL EXAMINATION:  ***   Vitals:   07/03/24 1032  BP: 130/67  Pulse: 83  Resp: 17  Temp: (!) 97.5 F (36.4 C)  SpO2: 100%   Filed Weights   07/03/24 1032  Weight: 162 lb 1.6 oz (73.5 kg)    Physical Exam  ***  LABORATORY DATA:   I have reviewed the data as listed.  No results found for any visits on 07/03/24.   RADIOGRAPHIC STUDIES:  I have personally reviewed the radiological images as listed and agree with the findings in the report.  CT OUTSIDE FILMS BODY/ABD/PELVIS Result Date: 06/28/2024 This examination belongs to an outside facility and is stored here for comparison purposes only.  Contact the originating outside institution for any associated report or interpretation.   No orders of the defined types were placed in this encounter.   CODE STATUS:   Future Appointments   Date Time Provider Department Center  07/03/2024 11:30 AM [facility] None     I spent a total of *** minutes during this encounter with the patient including review of chart and various tests results, discussions about plan of care and coordination of care plan.  This document was completed utilizing speech recognition software. Grammatical errors, random word insertions, pronoun errors, and incomplete sentences are an occasional consequence of this system due to software limitations, ambient noise, and hardware issues. Any formal questions or concerns about the content, text or information contained within the body of this dictation should be directly addressed to the provider for clarification.  "
